# Patient Record
Sex: Female | Born: 1995 | Race: Black or African American | Hispanic: No | Marital: Single | Smoking: Current every day smoker
Health system: Southern US, Community
[De-identification: ages and names within clinical notes are randomized; demographics above are authoritative.]

---
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
## 2018-03-20 ENCOUNTER — Emergency Department (HOSPITAL_COMMUNITY)
Admission: EM | Admit: 2018-03-20 | Discharge: 2018-03-20 | Disposition: A | Discharge status: Home / Self Care | Payer: Medicaid Other | Attending: Emergency Medicine | Admitting: Emergency Medicine

## 2018-03-20 ENCOUNTER — Encounter (HOSPITAL_COMMUNITY): Payer: Self-pay

## 2018-03-20 ENCOUNTER — Emergency Department (HOSPITAL_COMMUNITY): Payer: Medicaid Other

## 2018-03-20 ENCOUNTER — Other Ambulatory Visit: Payer: Self-pay

## 2018-03-20 DIAGNOSIS — F17210 Nicotine dependence, cigarettes, uncomplicated: Secondary | ICD-10-CM | POA: Insufficient documentation

## 2018-03-20 DIAGNOSIS — R1032 Left lower quadrant pain: Secondary | ICD-10-CM | POA: Insufficient documentation

## 2018-03-20 LAB — COMPREHENSIVE METABOLIC PANEL WITH GFR
ALT: 13 U/L (ref 0–44)
AST: 22 U/L (ref 15–41)
Albumin: 5 g/dL (ref 3.5–5.0)
Alkaline Phosphatase: 68 U/L (ref 38–126)
Anion gap: 8 (ref 5–15)
BUN: 9 mg/dL (ref 6–20)
CO2: 23 mmol/L (ref 22–32)
Calcium: 9.3 mg/dL (ref 8.9–10.3)
Chloride: 106 mmol/L (ref 98–111)
Creatinine, Ser: 0.71 mg/dL (ref 0.44–1.00)
GFR calc Af Amer: >60 mL/min
GFR calc non Af Amer: >60 mL/min
Glucose, Bld: 85 mg/dL (ref 70–99)
Potassium: 3.9 mmol/L (ref 3.5–5.1)
Sodium: 137 mmol/L (ref 135–145)
Total Bilirubin: 0.1 mg/dL — ABNORMAL LOW (ref 0.3–1.2)
Total Protein: 8.8 g/dL — ABNORMAL HIGH (ref 6.5–8.1)

## 2018-03-20 LAB — CBC WITH DIFFERENTIAL/PLATELET
ABS IMMATURE GRANULOCYTES: 0.01 10*3/uL (ref 0.00–0.07)
Basophils Absolute: 0.1 10*3/uL (ref 0.0–0.1)
Basophils Relative: 1 %
Eosinophils Absolute: 0.1 10*3/uL (ref 0.0–0.5)
Eosinophils Relative: 2 %
HEMATOCRIT: 43.7 % (ref 36.0–46.0)
Hemoglobin: 13.7 g/dL (ref 12.0–15.0)
IMMATURE GRANULOCYTES: 0 %
LYMPHS ABS: 2.7 10*3/uL (ref 0.7–4.0)
LYMPHS PCT: 39 %
MCH: 29 pg (ref 26.0–34.0)
MCHC: 31.4 g/dL (ref 30.0–36.0)
MCV: 92.4 fL (ref 80.0–100.0)
Monocytes Absolute: 0.7 10*3/uL (ref 0.1–1.0)
Monocytes Relative: 9 %
NEUTROS ABS: 3.5 10*3/uL (ref 1.7–7.7)
NEUTROS PCT: 49 %
PLATELETS: 290 10*3/uL (ref 150–400)
RBC: 4.73 MIL/uL (ref 3.87–5.11)
RDW: 12.5 % (ref 11.5–15.5)
WBC: 7 10*3/uL (ref 4.0–10.5)
nRBC: 0 % (ref 0.0–0.2)

## 2018-03-20 LAB — URINALYSIS, ROUTINE W REFLEX MICROSCOPIC
Bilirubin Urine: NEGATIVE
GLUCOSE, UA: NEGATIVE mg/dL
HGB URINE DIPSTICK: NEGATIVE
KETONES UR: NEGATIVE mg/dL
LEUKOCYTE UA: NEGATIVE
Nitrite: NEGATIVE
PROTEIN: NEGATIVE mg/dL
Specific Gravity, Urine: 1.025 (ref 1.005–1.030)
pH: 5 (ref 5.0–8.0)

## 2018-03-20 LAB — LIPASE, BLOOD: Lipase: 34 U/L (ref 11–51)

## 2018-03-20 LAB — PREGNANCY, URINE: Preg Test, Ur: NEGATIVE

## 2018-03-20 MED ORDER — IOHEXOL 300 MG/ML  SOLN
100.0000 mL | Freq: Once | INTRAMUSCULAR | Status: AC | PRN
  Administered 2018-03-20: 100 mL via INTRAVENOUS

## 2018-03-20 MED ORDER — FENTANYL CITRATE (PF) 100 MCG/2ML IJ SOLN
50.0000 ug | Freq: Once | INTRAMUSCULAR | Status: AC
  Administered 2018-03-20: 50 ug via INTRAVENOUS
  Filled 2018-03-20: qty 2

## 2018-03-20 MED ORDER — DICYCLOMINE HCL 20 MG PO TABS: 20.0000 mg | ORAL_TABLET | Freq: Three times a day (TID) | ORAL | 0 refills | Status: DC | PRN

## 2018-03-20 MED ORDER — IBUPROFEN 800 MG PO TABS: 800.0000 mg | ORAL_TABLET | Freq: Three times a day (TID) | ORAL | 0 refills | Status: DC | PRN

## 2018-03-20 MED ORDER — SODIUM CHLORIDE 0.9 % IV BOLUS
500.0000 mL | Freq: Once | INTRAVENOUS | Status: AC
  Administered 2018-03-20: 500 mL via INTRAVENOUS

## 2018-03-20 NOTE — ED Triage Notes (Signed)
Pt reports severe lower left abdominal pain that radiates to pelvic bone. Pt reports pain is intermittent at this time. Denies vaginal discharge. Denies urinary symptoms

## 2018-03-20 NOTE — ED Provider Notes (Signed)
Emergency Department Provider Note   I have reviewed the triage vital signs and the nursing notes.   HISTORY  Chief Complaint Abdominal Pain   HPI Kayla Day is a 23 y.o. female with no significant PMH presents to the emergency department for evaluation of acute onset left lower quadrant abdominal pain radiating to the flank.  Patient describes intermittent, severe pain.  She is not experiencing any vaginal bleeding or discharge.  No UTI symptoms.  No fevers or chills.  No gross hematuria.  No pain similar to this in the past.  She denies any diarrhea or constipation symptoms.  No blood in the bowel movements.  No history of ovarian cysts.  She noticed pain while driving today states that it is been very regular but intermittent.  No clear exacerbating or modifying factors.    History reviewed. No pertinent past medical history.  There are no active problems to display for this patient.   History reviewed. No pertinent surgical history.   Allergies Bee venom; Hydrocodone; and Other  No family history on file.  Social History Social History   Tobacco Use  . Smoking status: Current Every Day Smoker    Packs/day: 1.00    Types: Cigarettes  Substance Use Topics  . Alcohol use: Yes    Comment: occ  . Drug use: Never    Review of Systems  Constitutional: No fever/chills Eyes: No visual changes. ENT: No sore throat. Cardiovascular: Denies chest pain. Respiratory: Denies shortness of breath. Gastrointestinal: Positive lower abdominal pain.  No nausea, no vomiting.  No diarrhea.  No constipation. Genitourinary: Negative for dysuria. Musculoskeletal: Negative for back pain. Skin: Negative for rash. Neurological: Negative for headaches, focal weakness or numbness.  10-point ROS otherwise negative.  ____________________________________________   PHYSICAL EXAM:  VITAL SIGNS: ED Triage Vitals  Enc Vitals Group     BP 03/20/18 1609 130/81     Pulse Rate  03/20/18 1609 91     Resp 03/20/18 1609 18     Temp 03/20/18 1609 98.2 F (36.8 C)     Temp Source 03/20/18 1609 Oral     SpO2 03/20/18 1609 98 %     Weight 03/20/18 1610 136 lb (61.7 kg)     Height 03/20/18 1610 5\' 8"  (1.727 m)     Pain Score 03/20/18 1610 9   Constitutional: Alert and oriented. Well appearing and in no acute distress. Eyes: Conjunctivae are normal.  Head: Atraumatic. Nose: No congestion/rhinnorhea. Mouth/Throat: Mucous membranes are moist.  Neck: No stridor.   Cardiovascular: Normal rate, regular rhythm. Good peripheral circulation. Grossly normal heart sounds.   Respiratory: Normal respiratory effort.  No retractions. Lungs CTAB. Gastrointestinal: Soft with mild diffuse tenderness to focal tenderness in the LLQ. No rebound or guarding. No CVA tenderness to percussion. No distention.  Musculoskeletal: No lower extremity tenderness nor edema. No gross deformities of extremities. Neurologic:  Normal speech and language. No gross focal neurologic deficits are appreciated.  Skin:  Skin is warm, dry and intact. No rash noted.  ____________________________________________   LABS (all labs ordered are listed, but only abnormal results are displayed)  Labs Reviewed  COMPREHENSIVE METABOLIC PANEL - Abnormal; Notable for the following components:      Result Value   Total Protein 8.8 (*)    Total Bilirubin 0.1 (*)    All other components within normal limits  URINALYSIS, ROUTINE W REFLEX MICROSCOPIC  PREGNANCY, URINE  LIPASE, BLOOD  CBC WITH DIFFERENTIAL/PLATELET   ____________________________________________  RADIOLOGY  US Transvaginal Non-ob  Result Date: 03/20/2018 CLINICAL DATA:  LEFT lower quadrant pain. EXAM: TRANSABDOMINAL AND TRANSVAGINAL ULTRASOUND OF PELVIS DOPPLER ULTRASOUND OF OVARIES TECHNIQUE: Both transabdominal and transvaginal ultrasound examinations of the pelvis were performed. Transabdominal technique was performed for global imaging of the  pelvis including uterus, ovaries, adnexal regions, and pelvic cul-de-sac. It was necessary to proceed with endovaginal exam following the transabdominal exam to visualize the endometrium and adnexa. Color and duplex Doppler ultrasound was utilized to evaluate blood flow to the ovaries. COMPARISON:  None. FINDINGS: Uterus Measurements: 6.9 x 3.1 x 4.3 cm = volume: 49 mL. No fibroids or other mass visualized. Endometrium Thickness: 6 mm.  No focal abnormality visualized. Right ovary Measurements: 4 x 1.4 x 3.5 cm = volume: 10.2 mL. Normal appearance/no adnexal mass. Left ovary Measurements: 3.6 x 1.2 x 1.9 cm = volume: 4.3 mL. Normal appearance/no adnexal mass. Pulsed Doppler evaluation of both ovaries demonstrates normal low-resistance arterial and venous waveforms. Other findings Small volume free fluid in the pelvis within physiologic range. IMPRESSION: Normal pelvic ultrasound. Electronically Signed   By: Awilda Metro M.D.   On: 03/20/2018 21:43   US Pelvis Complete  Result Date: 03/20/2018 CLINICAL DATA:  LEFT lower quadrant pain. EXAM: TRANSABDOMINAL AND TRANSVAGINAL ULTRASOUND OF PELVIS DOPPLER ULTRASOUND OF OVARIES TECHNIQUE: Both transabdominal and transvaginal ultrasound examinations of the pelvis were performed. Transabdominal technique was performed for global imaging of the pelvis including uterus, ovaries, adnexal regions, and pelvic cul-de-sac. It was necessary to proceed with endovaginal exam following the transabdominal exam to visualize the endometrium and adnexa. Color and duplex Doppler ultrasound was utilized to evaluate blood flow to the ovaries. COMPARISON:  None. FINDINGS: Uterus Measurements: 6.9 x 3.1 x 4.3 cm = volume: 49 mL. No fibroids or other mass visualized. Endometrium Thickness: 6 mm.  No focal abnormality visualized. Right ovary Measurements: 4 x 1.4 x 3.5 cm = volume: 10.2 mL. Normal appearance/no adnexal mass. Left ovary Measurements: 3.6 x 1.2 x 1.9 cm = volume: 4.3 mL.  Normal appearance/no adnexal mass. Pulsed Doppler evaluation of both ovaries demonstrates normal low-resistance arterial and venous waveforms. Other findings Small volume free fluid in the pelvis within physiologic range. IMPRESSION: Normal pelvic ultrasound. Electronically Signed   By: Awilda Metro M.D.   On: 03/20/2018 21:43   Ct Abdomen Pelvis W Contrast  Result Date: 03/20/2018 CLINICAL DATA:  Left-sided abdominal pain EXAM: CT ABDOMEN AND PELVIS WITH CONTRAST TECHNIQUE: Multidetector CT imaging of the abdomen and pelvis was performed using the standard protocol following bolus administration of intravenous contrast. CONTRAST:  OMNIPAQUE IOHEXOL 300 MG/ML  SOLN COMPARISON:  CT 03/01/2015 FINDINGS: Lower chest: No acute abnormality. Hepatobiliary: No focal liver abnormality is seen. No gallstones, gallbladder wall thickening, or biliary dilatation. Pancreas: Unremarkable. No pancreatic ductal dilatation or surrounding inflammatory changes. Spleen: Normal in size without focal abnormality. Adrenals/Urinary Tract: Adrenal glands are unremarkable. Kidneys are normal, without renal calculi, focal lesion, or hydronephrosis. Bladder is unremarkable. Stomach/Bowel: Stomach is within normal limits. Appendix appears normal. No evidence of bowel wall thickening, distention, or inflammatory changes. Vascular/Lymphatic: No significant vascular findings are present. No enlarged abdominal or pelvic lymph nodes. Reproductive: Uterus and bilateral adnexa are unremarkable. Other: No free air.  Small amount of free fluid in the pelvis Musculoskeletal: No acute or significant osseous findings. IMPRESSION: 1. No CT evidence for acute intra-abdominal or pelvic abnormality. 2. Small amount of free fluid in the pelvis Electronically Signed   By: Adrian Prows.D.  On: 03/20/2018 19:43   [place] Art/ven Flow Abd Pelv Doppler  Result Date: 03/20/2018 CLINICAL DATA:  LEFT lower quadrant pain. EXAM: TRANSABDOMINAL AND  TRANSVAGINAL ULTRASOUND OF PELVIS DOPPLER ULTRASOUND OF OVARIES TECHNIQUE: Both transabdominal and transvaginal ultrasound examinations of the pelvis were performed. Transabdominal technique was performed for global imaging of the pelvis including uterus, ovaries, adnexal regions, and pelvic cul-de-sac. It was necessary to proceed with endovaginal exam following the transabdominal exam to visualize the endometrium and adnexa. Color and duplex Doppler ultrasound was utilized to evaluate blood flow to the ovaries. COMPARISON:  None. FINDINGS: Uterus Measurements: 6.9 x 3.1 x 4.3 cm = volume: 49 mL. No fibroids or other mass visualized. Endometrium Thickness: 6 mm.  No focal abnormality visualized. Right ovary Measurements: 4 x 1.4 x 3.5 cm = volume: 10.2 mL. Normal appearance/no adnexal mass. Left ovary Measurements: 3.6 x 1.2 x 1.9 cm = volume: 4.3 mL. Normal appearance/no adnexal mass. Pulsed Doppler evaluation of both ovaries demonstrates normal low-resistance arterial and venous waveforms. Other findings Small volume free fluid in the pelvis within physiologic range. IMPRESSION: Normal pelvic ultrasound. Electronically Signed   By: Awilda Metro M.D.   On: 03/20/2018 21:43    ____________________________________________   PROCEDURES  Procedure(s) performed:   Procedures  None  ____________________________________________   INITIAL IMPRESSION / ASSESSMENT AND PLAN / ED COURSE  Pertinent labs & imaging results that were available during my care of the patient were reviewed by me and considered in my medical decision making (see chart for details).  Patient describes intermittent left lower quadrant abdominal pain radiating to the flank.  Differential at this time is broad and includes GI, ureteral stone, torsion.  Lower suspicion for torsion given radiation of symptoms and tenderness on abdominal exam.  Plan to begin with CT abdomen pelvis along with IV fluids, pain medication, labs, and  reassess.  Patient CT scan with no acute findings.  Pain is improved somewhat.  Labs are largely unremarkable.  Given the severe, lower abdominal symptoms and character of her pain I discussed with the patient obtaining a transvaginal ultrasound to rule out ovarian torsion.  Discussed that I would have to call in a radiology technician to perform the scan. Patient agreeable with plan.   08:40 PM I was called back to the room with patient becoming very agitated and angry.  She felt like her nurse was rude to her.  She began cursing and shouting and security had to be called to de-escalate the situation.  The patient's mother was asked to wait in the waiting room.  I went back and discussed things with the patient.  She agrees to stay for the ultrasound but I made clear that she will not be allowed to curse at staff or else she would be asked to leave.  Patient verbalized understanding and is more calm on my reevaluation.   TVUS negative for torsion. Updated the patient who was calm and appreciative. Motrin Rx and Bentyl given and discussed with the patient along with follow up plan. Apparently, patient and family yelling at nursing on the way out of the ED.  ____________________________________________  FINAL CLINICAL IMPRESSION(S) / ED DIAGNOSES  Final diagnoses:  Left lower quadrant abdominal pain     MEDICATIONS GIVEN DURING THIS VISIT:  Medications  sodium chloride 0.9 % bolus 500 mL (0 mLs Intravenous Stopped 03/20/18 2015)  fentaNYL (SUBLIMAZE) injection 50 mcg (50 mcg Intravenous Given 03/20/18 1834)  iohexol (OMNIPAQUE) 300 MG/ML solution 100 mL (100 mLs Intravenous  Contrast Given 03/20/18 1924)     NEW OUTPATIENT MEDICATIONS STARTED DURING THIS VISIT:  Discharge Medication List as of 03/20/2018  9:52 PM    START taking these medications   Details  dicyclomine (BENTYL) 20 MG tablet Take 1 tablet (20 mg total) by mouth 3 (three) times daily as needed for spasms (abdominal cramping  pain)., Starting Wed 03/20/2018, Print    ibuprofen (ADVIL,MOTRIN) 800 MG tablet Take 1 tablet (800 mg total) by mouth every 8 (eight) hours as needed for moderate pain., Starting Wed 03/20/2018, Print        Note:  This document was prepared using Dragon voice recognition software and may include unintentional dictation errors.  Alona Bene, MD Emergency Medicine    Long, Arlyss Repress, MD 03/21/18 [phone]

## 2018-03-20 NOTE — ED Notes (Signed)
This RN walked into pt's room after hearing the pt yelling at the [place], [place] who was d/cing her from the nurse's station. Pt was wanting to know "who the head nurse was who talked to her mama?" This RN went to room and told the pt that the head nurse Olegario Messier was in an emergency and was unable to come speak to her. Pt said "well why can't my mom or my friend come back here?" I stated that the mom was unable to since she was yelling, cussing and getting in staff member's faces. Pt began yelling louder stating "well that other fucking nurse should not had talked to me that way and she wouldn't not have been cussing. My friend didn't do anything back here and she had to bring me stuff that I needed." This RN just walked away while Dois Davenport, Charity fundraiser finished d/cing her.

## 2018-03-20 NOTE — ED Notes (Signed)
This nurse tried deescalating the situation by speaking with the mother of the patient. Mother of pt was in the hallway screaming and cursing at this nurse. I tried calming the mother by asking her to let me speak and ask questions about the situation. Mother continued to scream and curse at staff and myself. Security notified.

## 2018-03-20 NOTE — ED Notes (Signed)
Pt upset due to wait time for ultrasound. I asked pt if she was going to stay or go. Pt got upset and started cursing at me and door was closed I told charge nurse I was done taking care of pt due to cursing at me. Pt's mom came back and started cursing me at nursing station because she heard me tell charge nurse that I was done taking care of her. Security was called.

## 2018-03-20 NOTE — Discharge Instructions (Signed)

## 2020-06-13 IMAGING — US TRANSVAGINAL ULTRASOUND OF PELVIS
1 series · 13 of 25 positions shown · non-contrast
Comparison: None.

CLINICAL DATA: LEFT lower quadrant pain.

EXAM:
TRANSABDOMINAL AND TRANSVAGINAL ULTRASOUND OF PELVIS
DOPPLER ULTRASOUND OF OVARIES
TECHNIQUE: Both transabdominal and transvaginal ultrasound examinations of the
pelvis were performed. Transabdominal technique was performed for
global imaging of the pelvis including uterus, ovaries, adnexal
regions, and pelvic cul-de-sac.
It was necessary to proceed with endovaginal exam following the
transabdominal exam to visualize the endometrium and adnexa. Color
and duplex Doppler ultrasound was utilized to evaluate blood flow to
the ovaries.

[Series 1: transvaginal ultrasound of pelvis · 0.20mm/px · 13 of 117 slices shown]
[im 1/117]
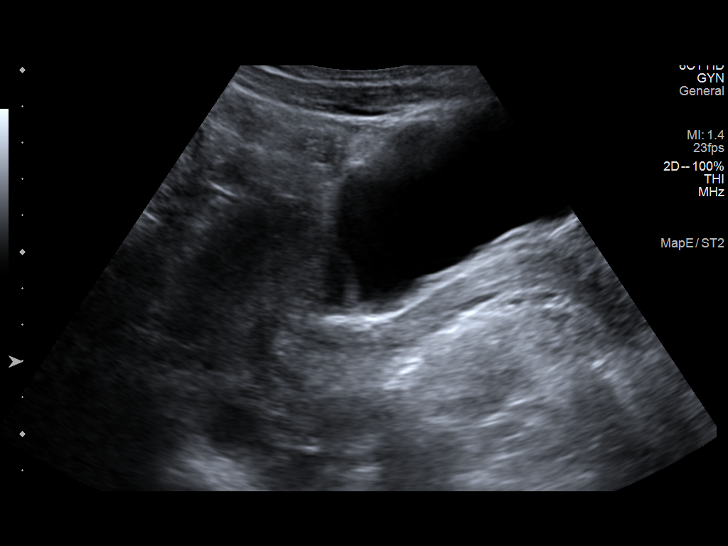
[im 10/117]
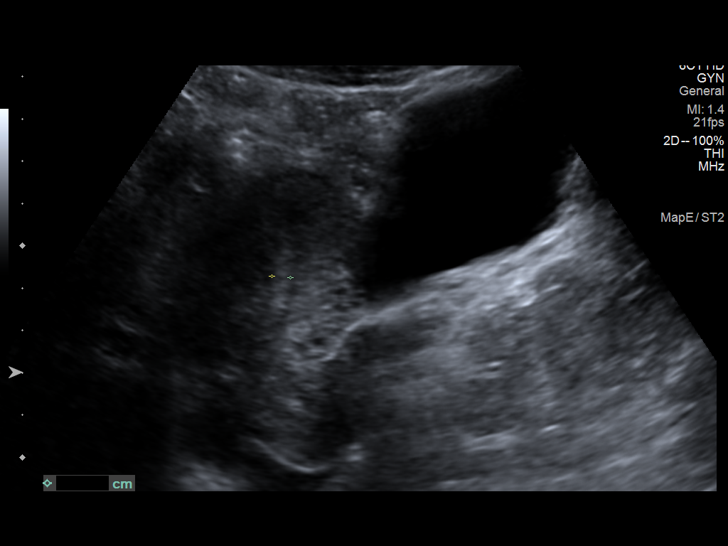
[im 20/117]
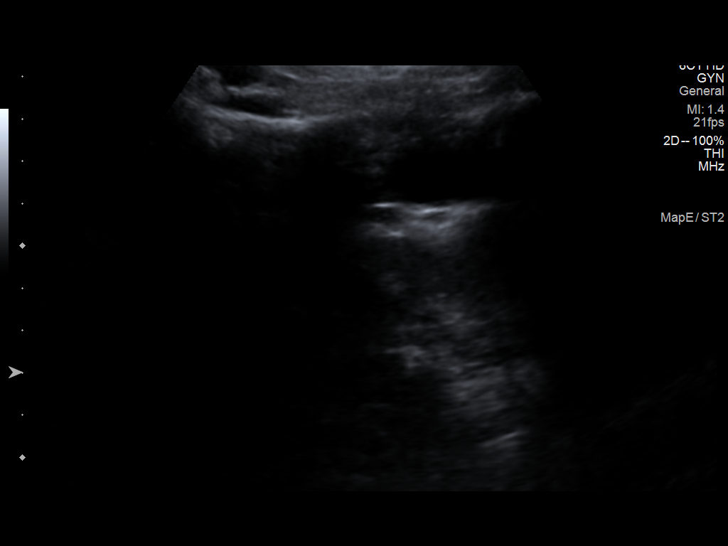
[im 30/117]
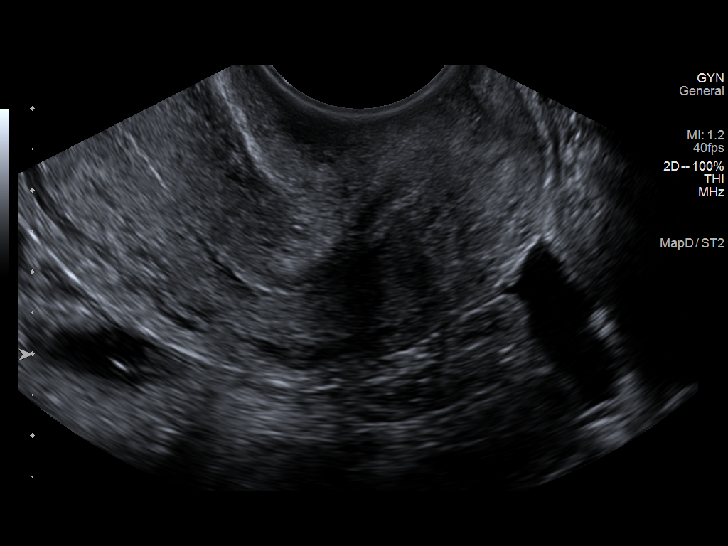
[im 39/117]
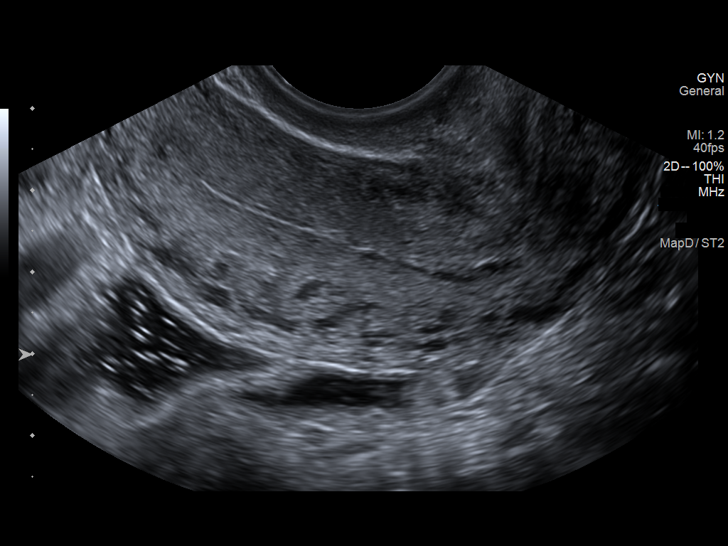
[im 49/117]
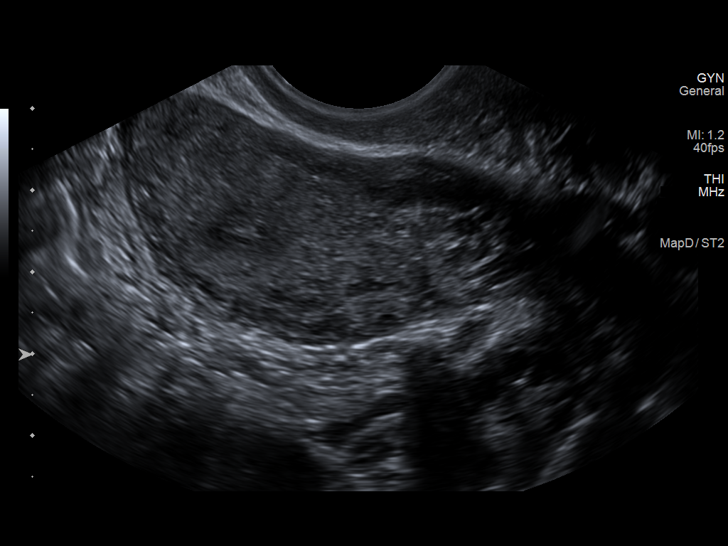
[im 59/117]
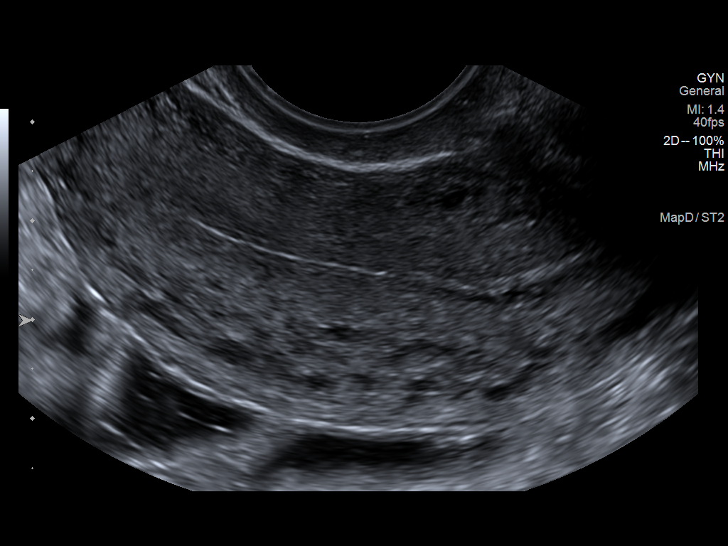
[im 68/117]
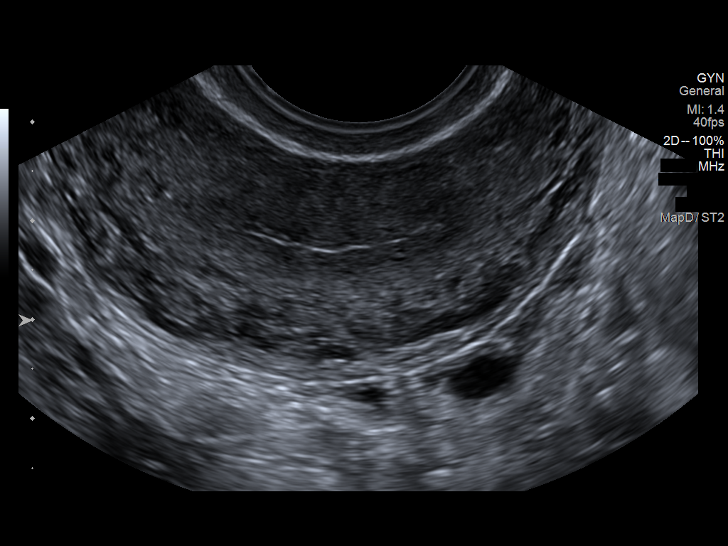
[im 78/117]
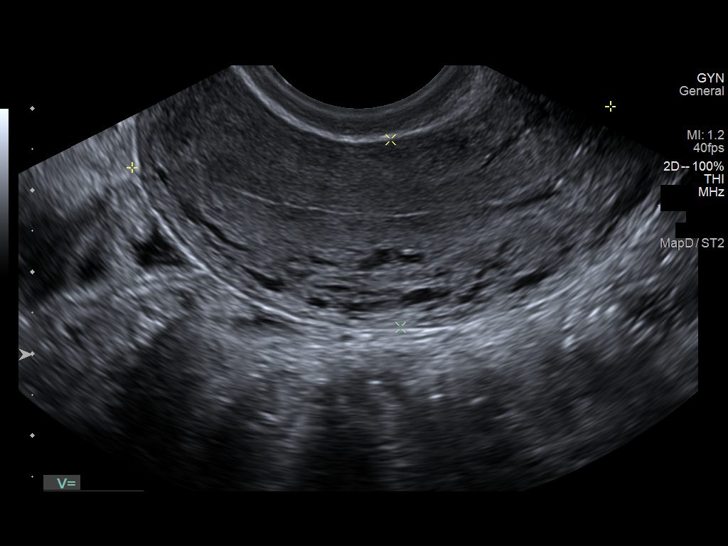
[im 88/117]
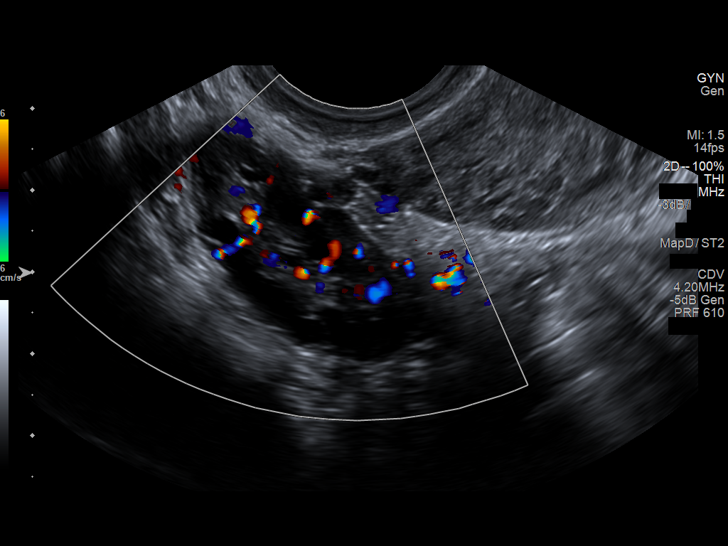
[im 97/117]
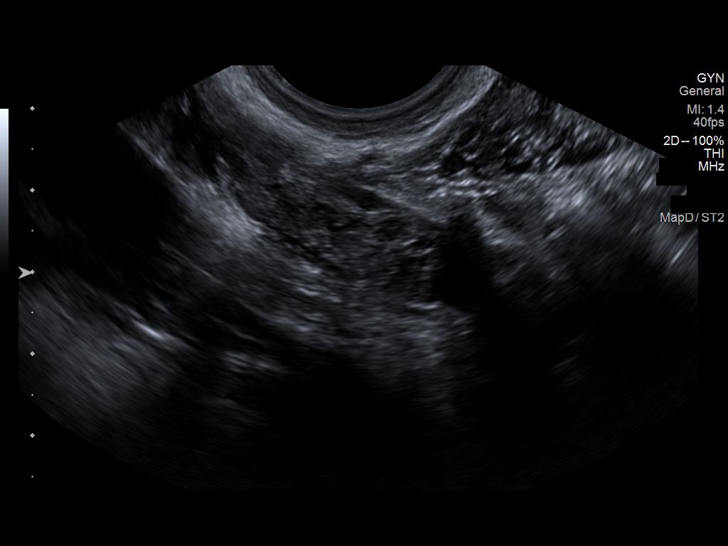
[im 107/117]
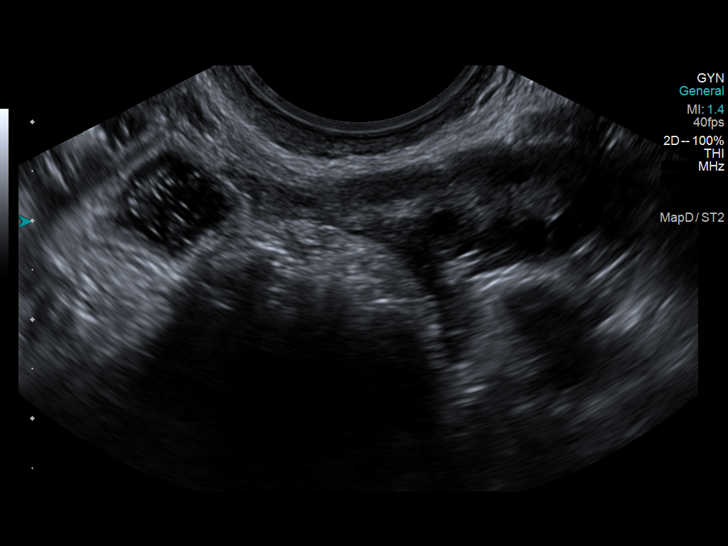
[im 117/117]
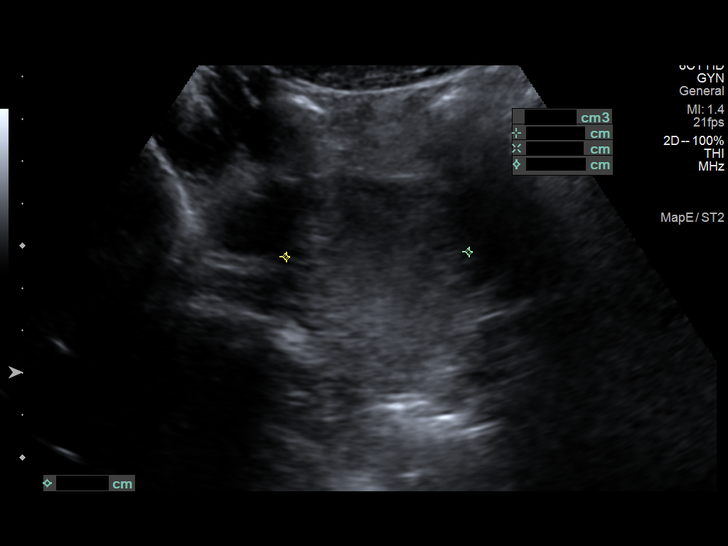

[13 of 25 positions shown; findings below may reference images not displayed]

FINDINGS: Uterus

Measurements: 6.9 x 3.1 x 4.3 cm = volume: 49 mL. No fibroids or
other mass visualized.

Endometrium

Thickness: 6 mm.  No focal abnormality visualized.

Right ovary

Measurements: 4 x 1.4 x 3.5 cm = volume: 10.2 mL. Normal
appearance/no adnexal mass.

Left ovary

Measurements: 3.6 x 1.2 x 1.9 cm = volume: 4.3 mL. Normal
appearance/no adnexal mass.

Pulsed Doppler evaluation of both ovaries demonstrates normal
low-resistance arterial and venous waveforms.

Other findings

Small volume free fluid in the pelvis within physiologic range.
IMPRESSION: Normal pelvic ultrasound.

## 2020-06-13 IMAGING — CT CT ABD-PELV W/ CM
2 of 4 series · 15 of 46 positions shown, 17 images · IV contrast (Isovue)
Comparison: CT 03/01/2015

CLINICAL DATA: Left-sided abdominal pain

EXAM:
CT ABDOMEN AND PELVIS WITH CONTRAST
TECHNIQUE: Multidetector CT imaging of the abdomen and pelvis was performed
using the standard protocol following bolus administration of
intravenous contrast.
CONTRAST:  100mL OMNIPAQUE IOHEXOL 300 MG/ML  SOLN

[Series 2: axial st · axial · 0.58mm/px · z∈[-457,-67]mm · 12 of 86 slices shown, 14 images]
[im 4/86  soft-tissue]
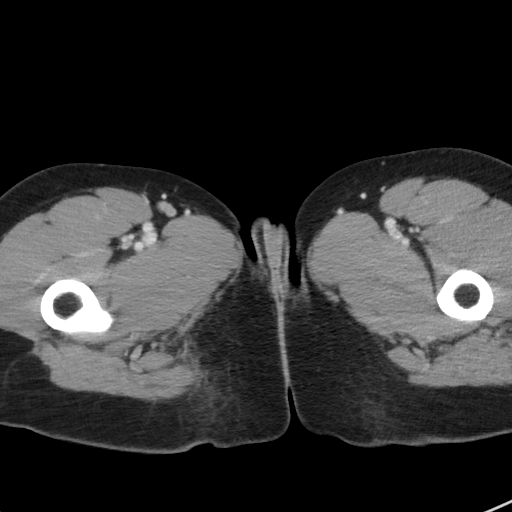
[im 4/86  bone]
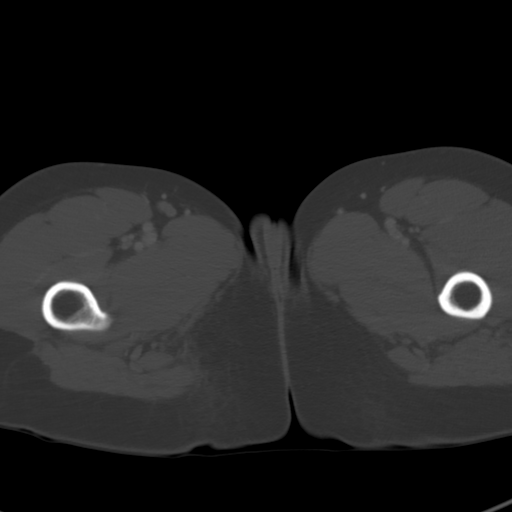
[im 11/86  soft-tissue]
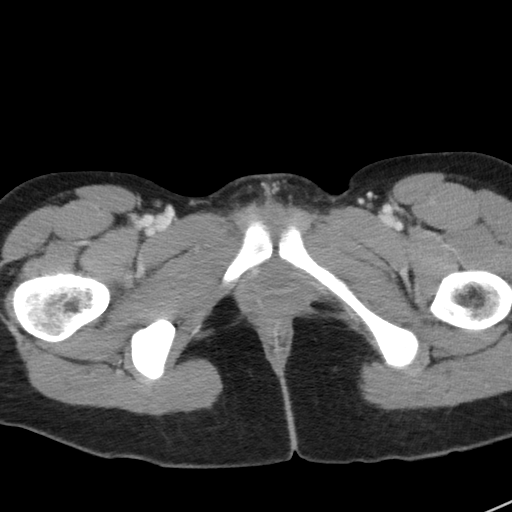
[im 18/86  soft-tissue]
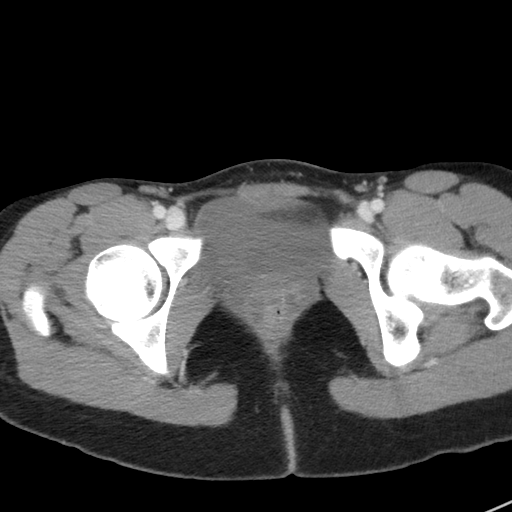
[im 25/86  soft-tissue]
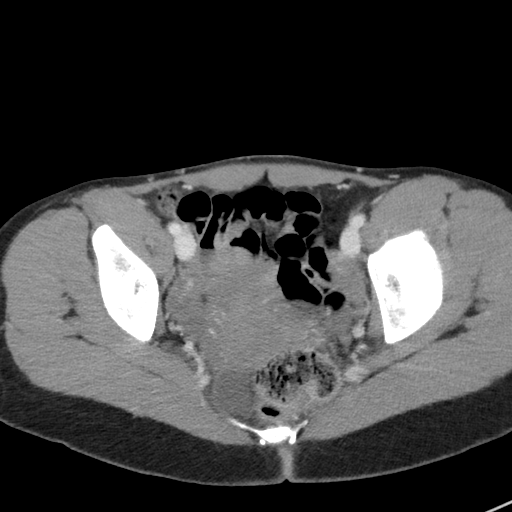
[im 32/86  soft-tissue]
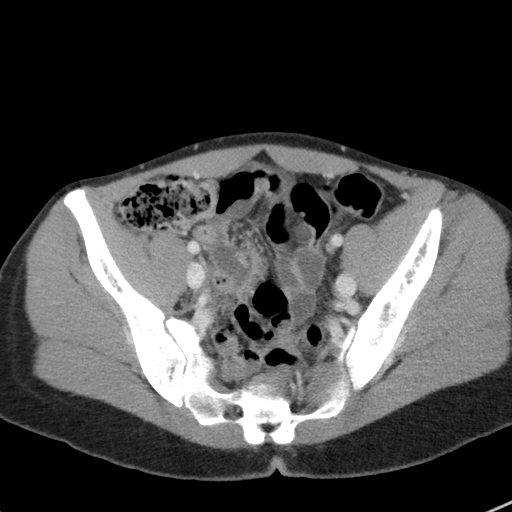
[im 39/86  soft-tissue]
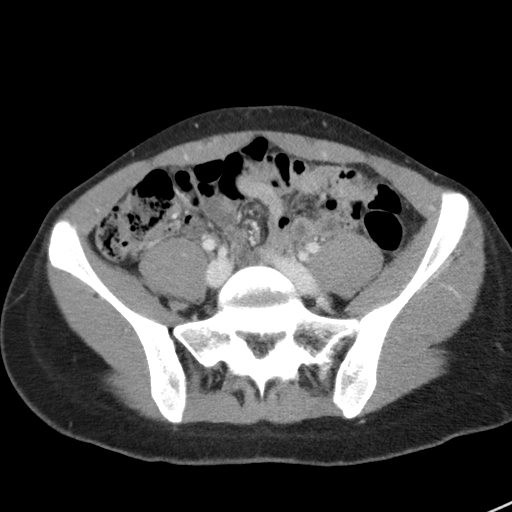
[im 47/86  soft-tissue]
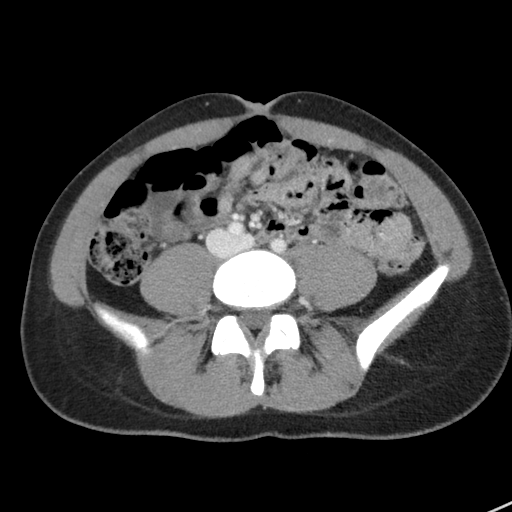
[im 54/86  soft-tissue]
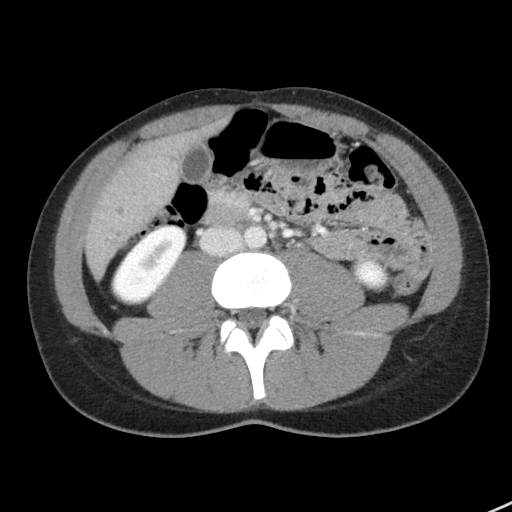
[im 61/86  soft-tissue]
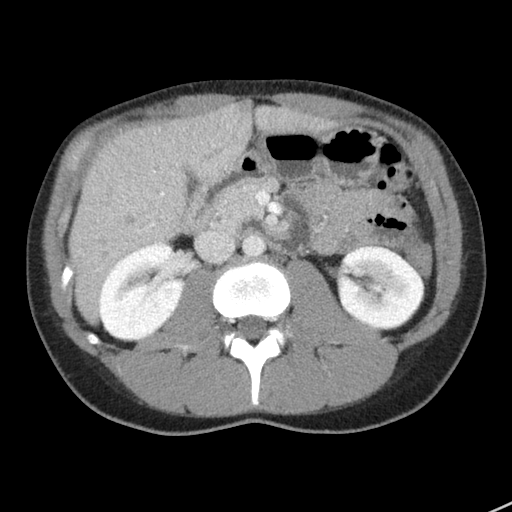
[im 61/86  bone]
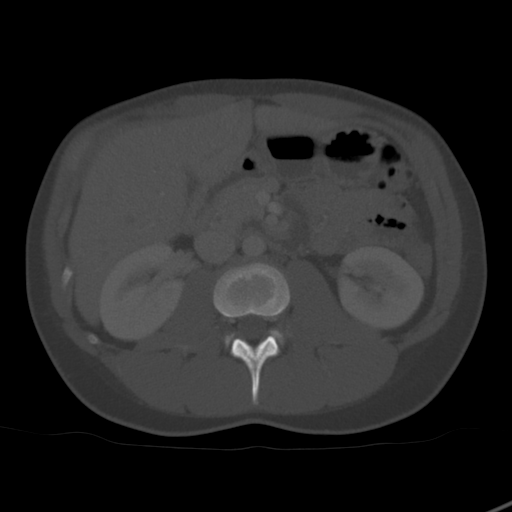
[im 68/86  soft-tissue]
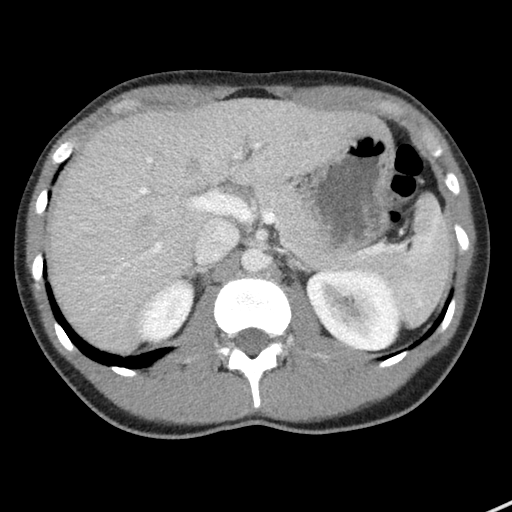
[im 75/86  soft-tissue]
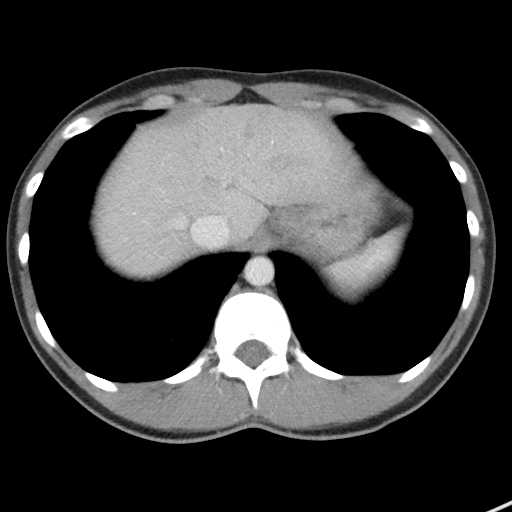
[im 82/86  soft-tissue]
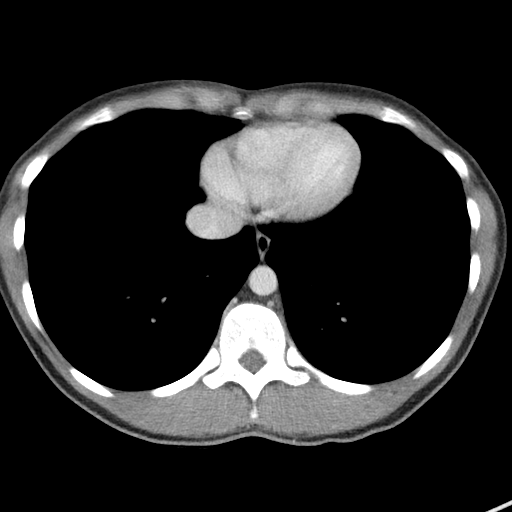

[Series 5: coronal st · coronal · 0.55mm/px · 3 of 69 slices shown]
[im 23/69  soft-tissue]
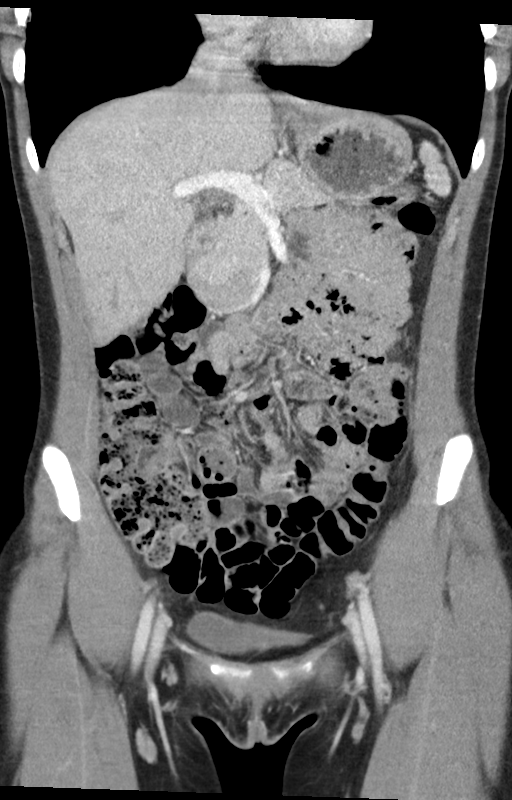
[im 31/69  soft-tissue]
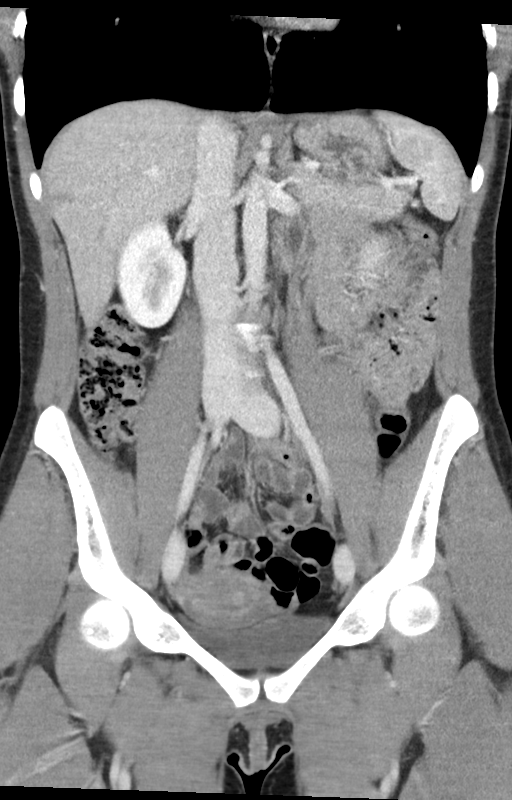
[im 38/69  soft-tissue]
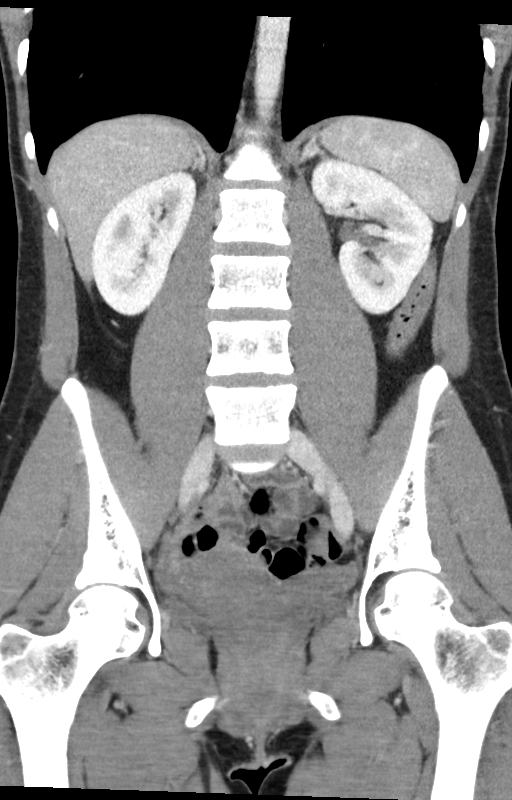

[15 of 46 positions shown; findings below may reference images not displayed]

FINDINGS: Lower chest: No acute abnormality.

Hepatobiliary: No focal liver abnormality is seen. No gallstones,
gallbladder wall thickening, or biliary dilatation.

Pancreas: Unremarkable. No pancreatic ductal dilatation or
surrounding inflammatory changes.

Spleen: Normal in size without focal abnormality.

Adrenals/Urinary Tract: Adrenal glands are unremarkable. Kidneys are
normal, without renal calculi, focal lesion, or hydronephrosis.
Bladder is unremarkable.

Stomach/Bowel: Stomach is within normal limits. Appendix appears
normal. No evidence of bowel wall thickening, distention, or
inflammatory changes.

Vascular/Lymphatic: No significant vascular findings are present. No
enlarged abdominal or pelvic lymph nodes.

Reproductive: Uterus and bilateral adnexa are unremarkable.

Other: No free air.  Small amount of free fluid in the pelvis

Musculoskeletal: No acute or significant osseous findings.
IMPRESSION: 1. No CT evidence for acute intra-abdominal or pelvic abnormality.
2. Small amount of free fluid in the pelvis

## 2020-08-12 ENCOUNTER — Ambulatory Visit (INDEPENDENT_AMBULATORY_CARE_PROVIDER_SITE_OTHER): Payer: Medicaid Other | Admitting: Orthopaedic Surgery

## 2020-08-12 ENCOUNTER — Ambulatory Visit (INDEPENDENT_AMBULATORY_CARE_PROVIDER_SITE_OTHER): Payer: Self-pay

## 2020-08-12 ENCOUNTER — Other Ambulatory Visit: Payer: Self-pay

## 2020-08-12 VITALS — Ht 68.0 in | Wt 146.0 lb

## 2020-08-12 DIAGNOSIS — M67432 Ganglion, left wrist: Secondary | ICD-10-CM

## 2020-08-12 DIAGNOSIS — M25532 Pain in left wrist: Secondary | ICD-10-CM

## 2020-08-12 MED ORDER — BUPIVACAINE HCL 0.25 % IJ SOLN
1.0000 mL | INTRAMUSCULAR | Status: AC | PRN
  Administered 2020-08-12: 1 mL

## 2020-08-12 NOTE — Progress Notes (Signed)
   Office Visit Note   Patient: Kayla Day           Date of Birth: Apr 01, 1995           MRN: [id number] Visit Date: 08/12/2020              Requested by: [facility] [address] [place],  [place] [address] PCP: [facility]   Assessment & Plan: Visit Diagnoses:  1. Pain in left wrist   2. Ganglion cyst of dorsum of left wrist     Plan: Aspiration left wrist obtained with typical ganglion fluid.  Follow-Up Instructions: Return if symptoms worsen or fail to improve.   Orders:  Orders Placed This Encounter  Procedures   Hand/UE Inj   XR Wrist 2 Views Left   No orders of the defined types were placed in this encounter.     Procedures: Hand/UE Inj: L wrist for dorsal carpal ganglion on 08/12/2020 2:31 PM Medications: 1 mL bupivacaine 0.25 % Aspirate: 2 mL clear  We discussed 50% recurrence with aspiration.  She can return if she has ongoing problems we discussed 90% resolution with excision of the ganglion.  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  She can get a wrist splint that she can use intermittently which we discussed.  Follow-up as needed.   Clinical Data: No additional findings.   Subjective: Chief Complaint  Patient presents with   Left Hand - Pain    HPI  Review of Systems   Objective: Vital Signs: Ht 5\' 8"  (1.727 m)   Wt 146 lb (66.2 kg)   BMI 22.20 kg/m   Physical Exam  Ortho Exam  Specialty Comments:  No specialty comments available.  Imaging: XR Wrist 2 Views Left  Result Date: 08/12/2020 2 view x-rays left wrist obtained and reviewed.  This shows normal bony anatomy.  Normal soft tissue. Impression: Normal left wrist radiographs.    PMFS History: Patient Active Problem List   Diagnosis Date Noted   Ganglion cyst of dorsum of left wrist 08/12/2020    No past medical history on file.  No family history on file.  No past surgical history on file. Social History    Occupational History   Not on file  Tobacco Use   Smoking status: Every Day    Packs/day: 1.00    Types: Cigarettes   Smokeless tobacco: Not on file  Substance and Sexual Activity   Alcohol use: Yes    Comment: occ   Drug use: Never   Sexual activity: Not on file

## 2020-11-12 DIAGNOSIS — K029 Dental caries, unspecified: Secondary | ICD-10-CM | POA: Diagnosis not present

## 2020-11-29 DIAGNOSIS — F17210 Nicotine dependence, cigarettes, uncomplicated: Secondary | ICD-10-CM | POA: Diagnosis not present

## 2020-11-29 DIAGNOSIS — Z309 Encounter for contraceptive management, unspecified: Secondary | ICD-10-CM | POA: Diagnosis not present

## 2020-12-13 DIAGNOSIS — J209 Acute bronchitis, unspecified: Secondary | ICD-10-CM | POA: Diagnosis not present

## 2020-12-13 DIAGNOSIS — R059 Cough, unspecified: Secondary | ICD-10-CM | POA: Diagnosis not present

## 2020-12-13 DIAGNOSIS — Z87891 Personal history of nicotine dependence: Secondary | ICD-10-CM | POA: Diagnosis not present

## 2020-12-13 DIAGNOSIS — Z20822 Contact with and (suspected) exposure to covid-19: Secondary | ICD-10-CM | POA: Diagnosis not present

## 2021-02-01 ENCOUNTER — Other Ambulatory Visit (HOSPITAL_COMMUNITY): Payer: Self-pay

## 2021-02-01 DIAGNOSIS — Z6822 Body mass index (BMI) 22.0-22.9, adult: Secondary | ICD-10-CM | POA: Diagnosis not present

## 2021-02-01 DIAGNOSIS — F17210 Nicotine dependence, cigarettes, uncomplicated: Secondary | ICD-10-CM | POA: Diagnosis not present

## 2021-02-01 DIAGNOSIS — F419 Anxiety disorder, unspecified: Secondary | ICD-10-CM | POA: Diagnosis not present

## 2021-02-01 DIAGNOSIS — N926 Irregular menstruation, unspecified: Secondary | ICD-10-CM | POA: Diagnosis not present

## 2021-02-01 MED ORDER — ESCITALOPRAM OXALATE 10 MG PO TABS
ORAL_TABLET | ORAL | 0 refills | Status: DC
  Filled 2021-02-01: qty 30, 30d supply, fill #0

## 2021-02-01 MED ORDER — BUSPIRONE HCL 15 MG PO TABS
ORAL_TABLET | ORAL | 0 refills | Status: DC
  Filled 2021-02-01: qty 270, 90d supply, fill #0

## 2021-02-17 DIAGNOSIS — R059 Cough, unspecified: Secondary | ICD-10-CM | POA: Diagnosis not present

## 2021-02-17 DIAGNOSIS — Z20822 Contact with and (suspected) exposure to covid-19: Secondary | ICD-10-CM | POA: Diagnosis not present

## 2021-02-17 DIAGNOSIS — Z87891 Personal history of nicotine dependence: Secondary | ICD-10-CM | POA: Diagnosis not present

## 2021-02-17 DIAGNOSIS — B9789 Other viral agents as the cause of diseases classified elsewhere: Secondary | ICD-10-CM | POA: Diagnosis not present

## 2021-02-17 DIAGNOSIS — J069 Acute upper respiratory infection, unspecified: Secondary | ICD-10-CM | POA: Diagnosis not present

## 2021-02-21 ENCOUNTER — Other Ambulatory Visit (HOSPITAL_COMMUNITY): Payer: Self-pay

## 2021-02-21 DIAGNOSIS — Z309 Encounter for contraceptive management, unspecified: Secondary | ICD-10-CM | POA: Diagnosis not present

## 2021-02-21 MED ORDER — MEDROXYPROGESTERONE ACETATE 150 MG/ML IM SUSY
PREFILLED_SYRINGE | INTRAMUSCULAR | 0 refills | Status: AC
  Filled 2021-02-21: qty 1, 90d supply, fill #0

## 2021-03-19 DIAGNOSIS — R059 Cough, unspecified: Secondary | ICD-10-CM | POA: Diagnosis not present

## 2021-03-19 DIAGNOSIS — Z79899 Other long term (current) drug therapy: Secondary | ICD-10-CM | POA: Diagnosis not present

## 2021-03-19 DIAGNOSIS — R091 Pleurisy: Secondary | ICD-10-CM | POA: Diagnosis not present

## 2021-03-19 DIAGNOSIS — F419 Anxiety disorder, unspecified: Secondary | ICD-10-CM | POA: Diagnosis not present

## 2021-03-19 DIAGNOSIS — J40 Bronchitis, not specified as acute or chronic: Secondary | ICD-10-CM | POA: Diagnosis not present

## 2021-03-19 DIAGNOSIS — Z793 Long term (current) use of hormonal contraceptives: Secondary | ICD-10-CM | POA: Diagnosis not present

## 2021-03-19 DIAGNOSIS — R0600 Dyspnea, unspecified: Secondary | ICD-10-CM | POA: Diagnosis not present

## 2021-03-19 DIAGNOSIS — R791 Abnormal coagulation profile: Secondary | ICD-10-CM | POA: Diagnosis not present

## 2021-03-19 DIAGNOSIS — Z87891 Personal history of nicotine dependence: Secondary | ICD-10-CM | POA: Diagnosis not present

## 2021-03-19 DIAGNOSIS — R000 Tachycardia, unspecified: Secondary | ICD-10-CM | POA: Diagnosis not present

## 2021-03-19 DIAGNOSIS — Z20822 Contact with and (suspected) exposure to covid-19: Secondary | ICD-10-CM | POA: Diagnosis not present

## 2021-03-20 DIAGNOSIS — R791 Abnormal coagulation profile: Secondary | ICD-10-CM | POA: Diagnosis not present

## 2021-03-20 DIAGNOSIS — R000 Tachycardia, unspecified: Secondary | ICD-10-CM | POA: Diagnosis not present

## 2021-03-20 DIAGNOSIS — Z87891 Personal history of nicotine dependence: Secondary | ICD-10-CM | POA: Diagnosis not present

## 2021-03-20 DIAGNOSIS — R091 Pleurisy: Secondary | ICD-10-CM | POA: Diagnosis not present

## 2021-03-20 DIAGNOSIS — J40 Bronchitis, not specified as acute or chronic: Secondary | ICD-10-CM | POA: Diagnosis not present

## 2021-03-20 DIAGNOSIS — Z79899 Other long term (current) drug therapy: Secondary | ICD-10-CM | POA: Diagnosis not present

## 2021-03-20 DIAGNOSIS — Z20822 Contact with and (suspected) exposure to covid-19: Secondary | ICD-10-CM | POA: Diagnosis not present

## 2021-03-20 DIAGNOSIS — F419 Anxiety disorder, unspecified: Secondary | ICD-10-CM | POA: Diagnosis not present

## 2021-03-20 DIAGNOSIS — Z793 Long term (current) use of hormonal contraceptives: Secondary | ICD-10-CM | POA: Diagnosis not present

## 2021-04-03 DIAGNOSIS — J069 Acute upper respiratory infection, unspecified: Secondary | ICD-10-CM | POA: Diagnosis not present

## 2021-04-03 DIAGNOSIS — Z87891 Personal history of nicotine dependence: Secondary | ICD-10-CM | POA: Diagnosis not present

## 2021-04-03 DIAGNOSIS — Z20822 Contact with and (suspected) exposure to covid-19: Secondary | ICD-10-CM | POA: Diagnosis not present

## 2021-04-03 DIAGNOSIS — R059 Cough, unspecified: Secondary | ICD-10-CM | POA: Diagnosis not present

## 2021-04-19 DIAGNOSIS — Z7251 High risk heterosexual behavior: Secondary | ICD-10-CM | POA: Diagnosis not present

## 2021-05-16 DIAGNOSIS — Z6823 Body mass index (BMI) 23.0-23.9, adult: Secondary | ICD-10-CM | POA: Diagnosis not present

## 2021-05-16 DIAGNOSIS — F17210 Nicotine dependence, cigarettes, uncomplicated: Secondary | ICD-10-CM | POA: Diagnosis not present

## 2021-05-16 DIAGNOSIS — H6091 Unspecified otitis externa, right ear: Secondary | ICD-10-CM | POA: Diagnosis not present

## 2021-05-16 DIAGNOSIS — Z309 Encounter for contraceptive management, unspecified: Secondary | ICD-10-CM | POA: Diagnosis not present

## 2021-05-21 DIAGNOSIS — F17210 Nicotine dependence, cigarettes, uncomplicated: Secondary | ICD-10-CM | POA: Diagnosis not present

## 2021-05-21 DIAGNOSIS — Z6823 Body mass index (BMI) 23.0-23.9, adult: Secondary | ICD-10-CM | POA: Diagnosis not present

## 2021-05-21 DIAGNOSIS — H6091 Unspecified otitis externa, right ear: Secondary | ICD-10-CM | POA: Diagnosis not present

## 2021-05-21 DIAGNOSIS — M545 Low back pain, unspecified: Secondary | ICD-10-CM | POA: Diagnosis not present

## 2021-05-21 DIAGNOSIS — I10 Essential (primary) hypertension: Secondary | ICD-10-CM | POA: Diagnosis not present

## 2021-05-21 DIAGNOSIS — M7918 Myalgia, other site: Secondary | ICD-10-CM | POA: Diagnosis not present

## 2021-08-08 DIAGNOSIS — F17210 Nicotine dependence, cigarettes, uncomplicated: Secondary | ICD-10-CM | POA: Diagnosis not present

## 2021-08-08 DIAGNOSIS — Z309 Encounter for contraceptive management, unspecified: Secondary | ICD-10-CM | POA: Diagnosis not present

## 2021-08-10 ENCOUNTER — Other Ambulatory Visit (HOSPITAL_COMMUNITY): Payer: Self-pay

## 2021-08-10 DIAGNOSIS — F419 Anxiety disorder, unspecified: Secondary | ICD-10-CM | POA: Diagnosis not present

## 2021-08-10 DIAGNOSIS — R61 Generalized hyperhidrosis: Secondary | ICD-10-CM | POA: Diagnosis not present

## 2021-08-10 DIAGNOSIS — F17210 Nicotine dependence, cigarettes, uncomplicated: Secondary | ICD-10-CM | POA: Diagnosis not present

## 2021-08-10 DIAGNOSIS — N926 Irregular menstruation, unspecified: Secondary | ICD-10-CM | POA: Diagnosis not present

## 2021-08-10 MED ORDER — BUSPIRONE HCL 15 MG PO TABS
ORAL_TABLET | ORAL | 0 refills | Status: DC
  Filled 2021-08-10: qty 270, 90d supply, fill #0

## 2021-08-10 MED ORDER — DRYSOL 20 % EX SOLN
CUTANEOUS | 1 refills | Status: DC
  Filled 2021-08-10: qty 35, 30d supply, fill #0

## 2021-08-11 ENCOUNTER — Other Ambulatory Visit (HOSPITAL_COMMUNITY): Payer: Self-pay

## 2021-08-15 ENCOUNTER — Other Ambulatory Visit (HOSPITAL_COMMUNITY): Payer: Self-pay

## 2021-10-03 DIAGNOSIS — H52221 Regular astigmatism, right eye: Secondary | ICD-10-CM | POA: Diagnosis not present

## 2021-10-19 DIAGNOSIS — N926 Irregular menstruation, unspecified: Secondary | ICD-10-CM | POA: Diagnosis not present

## 2021-10-19 DIAGNOSIS — R61 Generalized hyperhidrosis: Secondary | ICD-10-CM | POA: Diagnosis not present

## 2021-10-19 DIAGNOSIS — R030 Elevated blood-pressure reading, without diagnosis of hypertension: Secondary | ICD-10-CM | POA: Diagnosis not present

## 2021-10-19 DIAGNOSIS — Z6823 Body mass index (BMI) 23.0-23.9, adult: Secondary | ICD-10-CM | POA: Diagnosis not present

## 2021-10-19 DIAGNOSIS — F419 Anxiety disorder, unspecified: Secondary | ICD-10-CM | POA: Diagnosis not present

## 2021-10-19 DIAGNOSIS — F17210 Nicotine dependence, cigarettes, uncomplicated: Secondary | ICD-10-CM | POA: Diagnosis not present

## 2021-11-01 DIAGNOSIS — Z309 Encounter for contraceptive management, unspecified: Secondary | ICD-10-CM | POA: Diagnosis not present

## 2021-11-01 DIAGNOSIS — R030 Elevated blood-pressure reading, without diagnosis of hypertension: Secondary | ICD-10-CM | POA: Diagnosis not present

## 2021-11-25 DIAGNOSIS — R300 Dysuria: Secondary | ICD-10-CM | POA: Diagnosis not present

## 2021-11-25 DIAGNOSIS — F419 Anxiety disorder, unspecified: Secondary | ICD-10-CM | POA: Diagnosis not present

## 2021-11-25 DIAGNOSIS — Z79899 Other long term (current) drug therapy: Secondary | ICD-10-CM | POA: Diagnosis not present

## 2021-11-25 DIAGNOSIS — Z881 Allergy status to other antibiotic agents status: Secondary | ICD-10-CM | POA: Diagnosis not present

## 2021-11-25 DIAGNOSIS — Z91030 Bee allergy status: Secondary | ICD-10-CM | POA: Diagnosis not present

## 2021-11-25 DIAGNOSIS — R8290 Unspecified abnormal findings in urine: Secondary | ICD-10-CM | POA: Diagnosis not present

## 2021-11-25 DIAGNOSIS — Z87891 Personal history of nicotine dependence: Secondary | ICD-10-CM | POA: Diagnosis not present

## 2022-01-06 DIAGNOSIS — Z20828 Contact with and (suspected) exposure to other viral communicable diseases: Secondary | ICD-10-CM | POA: Diagnosis not present

## 2022-01-06 DIAGNOSIS — F17210 Nicotine dependence, cigarettes, uncomplicated: Secondary | ICD-10-CM | POA: Diagnosis not present

## 2022-02-16 ENCOUNTER — Emergency Department (HOSPITAL_COMMUNITY)
Admission: EM | Admit: 2022-02-16 | Discharge: 2022-02-16 | Disposition: A | Discharge status: Home / Self Care | Payer: 59 | Attending: Emergency Medicine | Admitting: Emergency Medicine

## 2022-02-16 ENCOUNTER — Other Ambulatory Visit: Payer: Self-pay

## 2022-02-16 DIAGNOSIS — R399 Unspecified symptoms and signs involving the genitourinary system: Secondary | ICD-10-CM

## 2022-02-16 DIAGNOSIS — R3989 Other symptoms and signs involving the genitourinary system: Secondary | ICD-10-CM | POA: Diagnosis not present

## 2022-02-16 LAB — URINALYSIS, ROUTINE W REFLEX MICROSCOPIC
Bilirubin Urine: NEGATIVE
Glucose, UA: NEGATIVE mg/dL
Hgb urine dipstick: NEGATIVE
Ketones, ur: NEGATIVE mg/dL
Nitrite: NEGATIVE
Protein, ur: NEGATIVE mg/dL
Specific Gravity, Urine: 1.002 — ABNORMAL LOW (ref 1.005–1.030)
pH: 6 (ref 5.0–8.0)

## 2022-02-16 LAB — GC/CHLAMYDIA PROBE AMP (~~LOC~~) NOT AT ARMC
Chlamydia: NEGATIVE
Comment: NEGATIVE
Comment: NORMAL
Neisseria Gonorrhea: NEGATIVE

## 2022-02-16 LAB — PREGNANCY, URINE: Preg Test, Ur: NEGATIVE

## 2022-02-16 NOTE — ED Provider Triage Note (Signed)
Emergency Medicine Provider Triage Evaluation Note  Terrie Haring , a 27 y.o. female  was evaluated in triage.  Pt complains of cloudy urine.  States diagnosed with UTI in November 2023 and urine has been cloudy/discolored since.  Denies hematuria, dysuria, or frequency.  Denies fever, vomiting, or abdominal pain.  Review of Systems  Positive: Cloudy urine Negative: fever  Physical Exam  BP (!) 127/92 (BP Location: Right Arm)   Pulse 76   Temp 98 F (36.7 C)   Resp 16   SpO2 100%  Gen:   Awake, no distress   Resp:  Normal effort  MSK:   Moves extremities without difficulty  Other:  No CVA tenderness  Medical Decision Making  Medically screening exam initiated at 5:08 AM.  Appropriate orders placed.  Shirlee Whitmire was informed that the remainder of the evaluation will be completed by another provider, this initial triage assessment does not replace that evaluation, and the importance of remaining in the ED until their evaluation is complete.  Cloudy urine.  Denies other urinary symptoms.  No CVA tenderness.  UA and u-preg sent.   Larene Pickett, PA-C 02/16/22 [phone]

## 2022-02-16 NOTE — ED Triage Notes (Signed)
Pt presents to ED for concerns for recurrent UTI. c/o cloudy urine without foul smell. States ongoing symptoms when dx with UTI in November. Denies abdominal pain. Pt is also concerned for feeling lightheaded, weakness, and fatigue.

## 2022-02-16 NOTE — Discharge Instructions (Addendum)
Your urine looked normal today.  I do not think you have a urinary infection currently.  When you were treated in November, your culture did not show infection, I do not think you had a urinary infection then either.  The symptoms you are experiencing may be normal but it will be helpful for you to follow-up with an OB/GYN to get a full exam and make sure there are no further tests needed.  We will culture your urine and look for other types of infections in it, we will contact you if there needs to be any further treatment or follow-up.

## 2022-02-16 NOTE — ED Provider Notes (Signed)
[facility] Provider Note   CSN: [id number] Arrival date & time: 02/16/22  3790     History  No chief complaint on file.   Kayla Day is a 27 y.o. female.  Patient presents to the emergency department because she is concerned she may have a persistent urinary tract infection.  She was seen at another institution in November because she had cloudy urine with discomfort while urinating.  She reports that she has noticed that her urine has been persistently cloudy since this time.  There is often discoloration to the urine.  She no longer has pain with urination and there is no urinary frequency.  No unusual vaginal bleeding or vaginal discharge.  Patient reports that she has been having some symptoms of feeling lightheaded and off balance with generalized weakness and fatigue.  She is worried that this is also related to urinary infection.       Home Medications Prior to Admission medications   Medication Sig Start Date End Date Taking? Authorizing Provider  albuterol (PROVENTIL HFA;VENTOLIN HFA) 108 (90 Base) MCG/ACT inhaler Inhale 1-2 puffs into the lungs every 6 (six) hours as needed for wheezing or shortness of breath. Patient not taking: Reported on 08/12/2020    [provider]  aluminum chloride (DRYSOL) 20 % external solution Apply 1 Application Topically every 3 days 08/10/21     busPIRone (BUSPAR) 15 MG tablet Take 15 mg by mouth 3 (three) times daily. 06/07/20   [provider]  busPIRone (BUSPAR) 15 MG tablet Take 1 tablet by mouth three times a day 02/01/21     busPIRone (BUSPAR) 15 MG tablet Take 1 tablet by mouth three times a day 08/10/21     dicyclomine (BENTYL) 20 MG tablet Take 1 tablet (20 mg total) by mouth 3 (three) times daily as needed for spasms (abdominal cramping pain). Patient not taking: Reported on 08/12/2020 03/20/18   Long, Wonda Olds, MD  escitalopram (LEXAPRO) 10 MG tablet Take 1 tablet by mouth  every day 02/01/21     ibuprofen (ADVIL,MOTRIN) 800 MG tablet Take 1 tablet (800 mg total) by mouth every 8 (eight) hours as needed for moderate pain. Patient not taking: Reported on 08/12/2020 03/20/18   Margette Fast, MD  medroxyPROGESTERone (DEPO-PROVERA) 150 MG/ML injection Inject 150 mg into the muscle every 3 (three) months.  11/21/17   [provider]  medroxyPROGESTERone Acetate (DEPO-PROVERA) 150 MG/ML SUSY Inject 150 mg into the muscle every 3 months. 02/21/21         Allergies    Bee venom, Doxycycline, Hydrocodone, and Other    Review of Systems   Review of Systems  Physical Exam Updated Vital Signs BP (!) 127/92 (BP Location: Right Arm)   Pulse 76   Temp 98 F (36.7 C)   Resp 16   SpO2 100%  Physical Exam Vitals and nursing note reviewed.  Constitutional:      General: She is not in acute distress.    Appearance: She is well-developed.  HENT:     Head: Normocephalic and atraumatic.     Mouth/Throat:     Mouth: Mucous membranes are moist.  Eyes:     General: Vision grossly intact. Gaze aligned appropriately.     Extraocular Movements: Extraocular movements intact.     Conjunctiva/sclera: Conjunctivae normal.  Cardiovascular:     Rate and Rhythm: Normal rate and regular rhythm.     Pulses: Normal pulses.     Heart  sounds: Normal heart sounds, S1 normal and S2 normal. No murmur heard.    No friction rub. No gallop.  Pulmonary:     Effort: Pulmonary effort is normal. No respiratory distress.     Breath sounds: Normal breath sounds.  Abdominal:     General: Bowel sounds are normal.     Palpations: Abdomen is soft.     Tenderness: There is no abdominal tenderness. There is no guarding or rebound.     Hernia: No hernia is present.  Musculoskeletal:        General: No swelling.     Cervical back: Full passive range of motion without pain, normal range of motion and neck supple. No spinous process tenderness or muscular tenderness. Normal range of motion.      Right lower leg: No edema.     Left lower leg: No edema.  Skin:    General: Skin is warm and dry.     Capillary Refill: Capillary refill takes less than 2 seconds.     Findings: No ecchymosis, erythema, rash or wound.  Neurological:     General: No focal deficit present.     Mental Status: She is alert and oriented to person, place, and time.     GCS: GCS eye subscore is 4. GCS verbal subscore is 5. GCS motor subscore is 6.     Cranial Nerves: Cranial nerves 2-12 are intact.     Sensory: Sensation is intact.     Motor: Motor function is intact.     Coordination: Coordination is intact.  Psychiatric:        Attention and Perception: Attention normal.        Mood and Affect: Mood normal.        Speech: Speech normal.        Behavior: Behavior normal.     ED Results / Procedures / Treatments   Labs (all labs ordered are listed, but only abnormal results are displayed) Labs Reviewed  URINALYSIS, ROUTINE W REFLEX MICROSCOPIC - Abnormal; Notable for the following components:      Result Value   Color, Urine STRAW (*)    APPearance HAZY (*)    Specific Gravity, Urine 1.002 (*)    Leukocytes,Ua SMALL (*)    Bacteria, UA RARE (*)    All other components within normal limits  URINE CULTURE  PREGNANCY, URINE  GC/CHLAMYDIA PROBE AMP ([place]) NOT AT [facility]    EKG None  Radiology No results found.  Procedures Procedures    Medications Ordered in ED Medications - No data to display  ED Course/ Medical Decision Making/ A&P                             Medical Decision Making Amount and/or Complexity of Data Reviewed External Data Reviewed: labs and notes. Labs: ordered. Decision-making details documented in ED Course.   I did review her records from [facility] in November.  She presented with dysuria and had a slightly abnormal urinalysis.  She was treated with pyridium and Macrobid.  I did review her culture results, however, and it appeared to be normal flora, no  pathogens were identified.  It does not seem that she had a clear urinary tract infection at that time and her urinalysis today does not suggest infection.  Urine culture sent.  Will send GC and chlamydia on urine to make sure there is not urinary colonization.  I doubt this is  the case.  I have recommended that she follow-up with an OB/GYN for full exam because she has not had a gynecologic exam in 6 years.  She does not need any further treatment at this time.        Final Clinical Impression(s) / ED Diagnoses Final diagnoses:  Lower urinary tract symptoms    Rx / DC Orders ED Discharge Orders     None         Rafan Sanders, Gwenyth Allegra, MD 02/16/22 [phone]

## 2022-02-16 NOTE — ED Notes (Signed)
RN reviewed discharge instructions with pt. Pt verbalized understanding and had no further questions. VSS upon discharge. ?

## 2022-02-17 LAB — URINE CULTURE

## 2022-03-21 DIAGNOSIS — Z881 Allergy status to other antibiotic agents status: Secondary | ICD-10-CM | POA: Diagnosis not present

## 2022-03-21 DIAGNOSIS — F419 Anxiety disorder, unspecified: Secondary | ICD-10-CM | POA: Diagnosis not present

## 2022-03-21 DIAGNOSIS — Z91030 Bee allergy status: Secondary | ICD-10-CM | POA: Diagnosis not present

## 2022-03-21 DIAGNOSIS — R350 Frequency of micturition: Secondary | ICD-10-CM | POA: Diagnosis not present

## 2022-03-21 DIAGNOSIS — R3915 Urgency of urination: Secondary | ICD-10-CM | POA: Diagnosis not present

## 2022-03-21 DIAGNOSIS — R1030 Lower abdominal pain, unspecified: Secondary | ICD-10-CM | POA: Diagnosis not present

## 2022-03-21 DIAGNOSIS — Z87891 Personal history of nicotine dependence: Secondary | ICD-10-CM | POA: Diagnosis not present

## 2022-03-29 DIAGNOSIS — Z0000 Encounter for general adult medical examination without abnormal findings: Secondary | ICD-10-CM | POA: Diagnosis not present

## 2022-03-29 DIAGNOSIS — R61 Generalized hyperhidrosis: Secondary | ICD-10-CM | POA: Diagnosis not present

## 2022-03-29 DIAGNOSIS — N926 Irregular menstruation, unspecified: Secondary | ICD-10-CM | POA: Diagnosis not present

## 2022-03-29 DIAGNOSIS — F17210 Nicotine dependence, cigarettes, uncomplicated: Secondary | ICD-10-CM | POA: Diagnosis not present

## 2022-03-29 DIAGNOSIS — Z6826 Body mass index (BMI) 26.0-26.9, adult: Secondary | ICD-10-CM | POA: Diagnosis not present

## 2022-03-29 DIAGNOSIS — R030 Elevated blood-pressure reading, without diagnosis of hypertension: Secondary | ICD-10-CM | POA: Diagnosis not present

## 2022-03-29 DIAGNOSIS — F419 Anxiety disorder, unspecified: Secondary | ICD-10-CM | POA: Diagnosis not present

## 2022-04-12 ENCOUNTER — Other Ambulatory Visit (HOSPITAL_BASED_OUTPATIENT_CLINIC_OR_DEPARTMENT_OTHER): Payer: Self-pay

## 2022-04-12 ENCOUNTER — Telehealth (INDEPENDENT_AMBULATORY_CARE_PROVIDER_SITE_OTHER): Payer: 59 | Admitting: Pulmonary Disease

## 2022-04-12 DIAGNOSIS — R1111 Vomiting without nausea: Secondary | ICD-10-CM

## 2022-04-12 DIAGNOSIS — R0600 Dyspnea, unspecified: Secondary | ICD-10-CM

## 2022-04-12 MED ORDER — ONDANSETRON HCL 8 MG PO TABS
8.0000 mg | ORAL_TABLET | Freq: Three times a day (TID) | ORAL | 0 refills | Status: DC | PRN
  Filled 2022-04-12: qty 20, 7d supply, fill #0

## 2022-04-12 NOTE — Telephone Encounter (Signed)
Virtual Visit via Telephone Note  I connected with Kayla Day on 04/12/22 at  by telephone and verified that I am speaking with the correct person using two identifiers.  Location: Patient: Home Provider: [facility]   I discussed the limitations, risks, security and privacy concerns of performing an evaluation and management service by telephone and the availability of in person appointments. I also discussed with the patient that there may be a patient responsible charge related to this service. The patient expressed understanding and agreed to proceed.   History of Present Illness: 27 year old female with no pertinent medical history who presents with nausea, dizziness, shortness of breath. No recent fevers, chills. No sick contacts.   Observations/Objective: No acute distress  Assessment and Plan: Shortness of breath, nausea, dizziness - nonspecific complaints. Doubt this is respiratory etiology. --PRN zofran --Supportive management --Consider pregnancy test if appropriate  Follow Up Instructions:    I discussed the assessment and treatment plan with the patient. The patient was provided an opportunity to ask questions and all were answered. The patient agreed with the plan and demonstrated an understanding of the instructions.   The patient was advised to call back or seek an in-person evaluation if the symptoms worsen or if the condition fails to improve as anticipated.  I provided 20 minutes of non-face-to-face time during this encounter.   Ladanian Kelter Rodman Pickle, MD

## 2022-04-21 ENCOUNTER — Other Ambulatory Visit (HOSPITAL_BASED_OUTPATIENT_CLINIC_OR_DEPARTMENT_OTHER): Payer: Self-pay

## 2022-04-21 ENCOUNTER — Telehealth: Payer: Self-pay | Admitting: Nurse Practitioner

## 2022-04-21 DIAGNOSIS — J209 Acute bronchitis, unspecified: Secondary | ICD-10-CM

## 2022-04-21 MED ORDER — PREDNISONE 20 MG PO TABS: 40.0000 mg | ORAL_TABLET | Freq: Every day | ORAL | 0 refills | Status: AC

## 2022-04-21 MED ORDER — BENZONATATE 200 MG PO CAPS: 200.0000 mg | ORAL_CAPSULE | Freq: Three times a day (TID) | ORAL | 1 refills | Status: DC | PRN

## 2022-04-21 MED ORDER — ALBUTEROL SULFATE HFA 108 (90 BASE) MCG/ACT IN AERS: 2.0000 | INHALATION_SPRAY | Freq: Four times a day (QID) | RESPIRATORY_TRACT | 2 refills | Status: DC | PRN

## 2022-04-21 MED ORDER — AZITHROMYCIN 250 MG PO TABS: ORAL_TABLET | ORAL | 0 refills | Status: DC

## 2022-04-21 NOTE — Telephone Encounter (Signed)
04/21/2022 Today: Patient contacted the office with increased cough over the last 3 weeks. She is producing a small amount of phlegm but is not sure if it is discolored. She finds her cough is worse in the evenings and at night. She has noticed some associated wheezing and shortness of breath. The cough makes her feel nauseated at times. She doesn't have any significant nasal symptoms but her throat feels irritated in the morning. She denies any fevers, chills, hemoptysis, recent sick exposures, leg swelling, orthopnea. She does not have a history of asthma. She completed a FeNO at her workplace, which was elevated at 54 ppb. Plan to treat her for acute bronchitis with empiric azithromycin and prednisone burst. Cough control measures advised. Provided with rescue inhaler. Side effect profiles reviewed. Start flonase nasal spray and daily antihistamine. Advised that if she did not improve, she would need in person evaluation. Verbalized understanding. Nothing further needed.

## 2022-04-24 ENCOUNTER — Telehealth (HOSPITAL_BASED_OUTPATIENT_CLINIC_OR_DEPARTMENT_OTHER): Payer: Self-pay

## 2022-05-25 ENCOUNTER — Other Ambulatory Visit (HOSPITAL_BASED_OUTPATIENT_CLINIC_OR_DEPARTMENT_OTHER): Payer: Self-pay

## 2022-05-26 DIAGNOSIS — R030 Elevated blood-pressure reading, without diagnosis of hypertension: Secondary | ICD-10-CM | POA: Diagnosis not present

## 2022-05-26 DIAGNOSIS — Z6827 Body mass index (BMI) 27.0-27.9, adult: Secondary | ICD-10-CM | POA: Diagnosis not present

## 2022-05-26 DIAGNOSIS — Z3490 Encounter for supervision of normal pregnancy, unspecified, unspecified trimester: Secondary | ICD-10-CM | POA: Diagnosis not present

## 2022-05-26 DIAGNOSIS — F17210 Nicotine dependence, cigarettes, uncomplicated: Secondary | ICD-10-CM | POA: Diagnosis not present

## 2022-05-31 ENCOUNTER — Other Ambulatory Visit: Payer: Self-pay | Admitting: Obstetrics & Gynecology

## 2022-05-31 DIAGNOSIS — O3680X0 Pregnancy with inconclusive fetal viability, not applicable or unspecified: Secondary | ICD-10-CM

## 2022-06-01 ENCOUNTER — Ambulatory Visit (INDEPENDENT_AMBULATORY_CARE_PROVIDER_SITE_OTHER): Payer: 59

## 2022-06-01 DIAGNOSIS — Z3481 Encounter for supervision of other normal pregnancy, first trimester: Secondary | ICD-10-CM

## 2022-06-01 DIAGNOSIS — Z3A08 8 weeks gestation of pregnancy: Secondary | ICD-10-CM

## 2022-06-01 DIAGNOSIS — O3680X0 Pregnancy with inconclusive fetal viability, not applicable or unspecified: Secondary | ICD-10-CM

## 2022-06-01 NOTE — Progress Notes (Signed)
[place] 8+2 wks,single IUP with yolk sac,CRL 18.38 mm,normal ovaries,FHR 167 bpm

## 2022-06-14 DIAGNOSIS — Z3689 Encounter for other specified antenatal screening: Secondary | ICD-10-CM | POA: Diagnosis not present

## 2022-06-14 DIAGNOSIS — N912 Amenorrhea, unspecified: Secondary | ICD-10-CM | POA: Diagnosis not present

## 2022-06-14 DIAGNOSIS — Z3A10 10 weeks gestation of pregnancy: Secondary | ICD-10-CM | POA: Diagnosis not present

## 2022-06-14 DIAGNOSIS — O219 Vomiting of pregnancy, unspecified: Secondary | ICD-10-CM | POA: Diagnosis not present

## 2022-06-14 DIAGNOSIS — Z3201 Encounter for pregnancy test, result positive: Secondary | ICD-10-CM | POA: Diagnosis not present

## 2022-06-22 LAB — PANORAMA PRENATAL TEST FULL PANEL:PANORAMA TEST PLUS 5 ADDITIONAL MICRODELETIONS: FETAL FRACTION: 5.7

## 2022-07-03 ENCOUNTER — Other Ambulatory Visit: Payer: Self-pay

## 2022-07-03 DIAGNOSIS — Z3682 Encounter for antenatal screening for nuchal translucency: Secondary | ICD-10-CM

## 2022-07-05 ENCOUNTER — Other Ambulatory Visit: Payer: 59

## 2022-07-06 ENCOUNTER — Encounter: Payer: Self-pay | Admitting: *Deleted

## 2022-07-06 ENCOUNTER — Encounter: Payer: Self-pay | Admitting: Advanced Practice Midwife

## 2022-07-12 DIAGNOSIS — Z3689 Encounter for other specified antenatal screening: Secondary | ICD-10-CM | POA: Diagnosis not present

## 2022-07-27 ENCOUNTER — Other Ambulatory Visit (HOSPITAL_BASED_OUTPATIENT_CLINIC_OR_DEPARTMENT_OTHER): Payer: Self-pay | Admitting: Student

## 2022-07-27 ENCOUNTER — Ambulatory Visit (INDEPENDENT_AMBULATORY_CARE_PROVIDER_SITE_OTHER): Payer: 59

## 2022-07-27 ENCOUNTER — Ambulatory Visit (INDEPENDENT_AMBULATORY_CARE_PROVIDER_SITE_OTHER): Payer: 59 | Admitting: Student

## 2022-07-27 DIAGNOSIS — M25872 Other specified joint disorders, left ankle and foot: Secondary | ICD-10-CM | POA: Diagnosis not present

## 2022-07-27 DIAGNOSIS — M79672 Pain in left foot: Secondary | ICD-10-CM

## 2022-07-27 DIAGNOSIS — M79675 Pain in left toe(s): Secondary | ICD-10-CM | POA: Diagnosis not present

## 2022-07-27 NOTE — Progress Notes (Signed)
Chief Complaint: Left big toe pain     History of Present Illness:    Kayla Day is a 27 y.o. female presenting today for evaluation of her left big toe.  She states that she began having pain about a year ago with no known injury.  This has gradually continued to worsen and is now at the point where she has pain every day.  She describes that it often feels like it needs to pop, which often she is able to do and gets relief.  This relief is typically not long-lasting.  She denies any other treatments up to this point.  She is currently [redacted] weeks pregnant.   Surgical History:   None  PMH/PSH/Family History/Social History/Meds/Allergies:   No past medical history on file. No past surgical history on file. Social History   Socioeconomic History   Marital status: Single    Spouse name: Not on file   Number of children: Not on file   Years of education: Not on file   Highest education level: Not on file  Occupational History   Not on file  Tobacco Use   Smoking status: Every Day    Current packs/day: 1.00    Types: Cigarettes   Smokeless tobacco: Not on file  Substance and Sexual Activity   Alcohol use: Yes    Comment: occ   Drug use: Never   Sexual activity: Not on file  Other Topics Concern   Not on file  Social History Narrative   Not on file   Social Determinants of Health   Financial Resource Strain: Not on file  Food Insecurity: Not on file  Transportation Needs: Not on file  Physical Activity: Not on file  Stress: Not on file  Social Connections: Not on file   No family history on file. Allergies  Allergen Reactions   Bee Venom Swelling    numbness   Doxycycline    Hydrocodone Hives   Other Hives    TIDE PODS   Current Outpatient Medications  Medication Sig Dispense Refill   albuterol (VENTOLIN HFA) 108 (90 Base) MCG/ACT inhaler Inhale 2 puffs into the lungs every 6 (six) hours as needed for wheezing or  shortness of breath. 8 g 2   aluminum chloride (DRYSOL) 20 % external solution Apply 1 Application Topically every 3 days 35 mL 1   azithromycin (ZITHROMAX) 250 MG tablet Take 2 tablets on day one then take 1 tablet daily for four additional days 6 tablet 0   benzonatate (TESSALON) 200 MG capsule Take 1 capsule (200 mg total) by mouth 3 (three) times daily as needed for cough. 30 capsule 1   busPIRone (BUSPAR) 15 MG tablet Take 15 mg by mouth 3 (three) times daily.     busPIRone (BUSPAR) 15 MG tablet Take 1 tablet by mouth three times a day 270 tablet 0   busPIRone (BUSPAR) 15 MG tablet Take 1 tablet by mouth three times a day 270 tablet 0   dicyclomine (BENTYL) 20 MG tablet Take 1 tablet (20 mg total) by mouth 3 (three) times daily as needed for spasms (abdominal cramping pain). (Patient not taking: Reported on 08/12/2020) 20 tablet 0   escitalopram (LEXAPRO) 10 MG tablet Take 1 tablet by mouth every day 30 tablet 0   ibuprofen (ADVIL,MOTRIN) 800 MG tablet  Take 1 tablet (800 mg total) by mouth every 8 (eight) hours as needed for moderate pain. (Patient not taking: Reported on 08/12/2020) 21 tablet 0   medroxyPROGESTERone (DEPO-PROVERA) 150 MG/ML injection Inject 150 mg into the muscle every 3 (three) months.      medroxyPROGESTERone Acetate (DEPO-PROVERA) 150 MG/ML SUSY Inject 150 mg into the muscle every 3 months. 1 mL 0   ondansetron (ZOFRAN) 8 MG tablet Take 1 tablet (8 mg total) by mouth every 8 (eight) hours as needed for nausea or vomiting. 20 tablet 0   No current facility-administered medications for this visit.   No results found.  Review of Systems:   A ROS was performed including pertinent positives and negatives as documented in the HPI.  Physical Exam :   Constitutional: NAD and appears stated age Neurological: Alert and oriented Psych: Appropriate affect and cooperative Last menstrual period 04/04/2022.   Comprehensive Musculoskeletal Exam:    Tenderness palpation  surrounding the first MTP joint.  She does have full range of motion with flexion and extension.  Slight tenderness and pain extending down into the distal first metatarsal.  No pain with varus or valgus stress to the big toe.  DP pulse 2+.  Neurosensory exam intact.  Imaging:   Xray (left great toe 3 views): Negative for fracture or dislocation.    I personally reviewed and interpreted the radiographs.   Assessment:   27 y.o. female with chronic pain of her left great toe and has been slowly worsening over the past few months.  Based on her exam and presentation, I do believe that this presents like sesamoiditis.  She has not tried any treatments up to this point, so I would recommend beginning with conservative therapies.  We did discuss wearing soft, supportive footwear as well as icing as needed.  I would also recommend a short course of some anti-inflammatories, but will have patient discuss this with her OB before proceeding.  I am hopeful that she will begin to get some relief from reducing the inflammation, but can continue to follow as needed.  Plan :    -Continue with RICE therapy and anti-inflammatories as needed -Return to clinic as needed     I personally saw and evaluated the patient, and participated in the management and treatment plan.  Hazle Nordmann, PA-C Orthopedics  This document was dictated using Conservation officer, historic buildings. A reasonable attempt at proof reading has been made to minimize errors.

## 2022-08-16 DIAGNOSIS — Z3689 Encounter for other specified antenatal screening: Secondary | ICD-10-CM | POA: Diagnosis not present

## 2022-08-16 DIAGNOSIS — Z363 Encounter for antenatal screening for malformations: Secondary | ICD-10-CM | POA: Diagnosis not present

## 2022-08-16 DIAGNOSIS — Z3A19 19 weeks gestation of pregnancy: Secondary | ICD-10-CM | POA: Diagnosis not present

## 2023-07-18 ENCOUNTER — Ambulatory Visit: Admitting: Surgical

## 2023-07-19 ENCOUNTER — Encounter: Payer: Self-pay | Admitting: Orthopaedic Surgery

## 2023-07-19 ENCOUNTER — Ambulatory Visit: Admitting: Orthopaedic Surgery

## 2023-07-19 ENCOUNTER — Other Ambulatory Visit (INDEPENDENT_AMBULATORY_CARE_PROVIDER_SITE_OTHER): Payer: Self-pay

## 2023-07-19 VITALS — Wt 173.6 lb

## 2023-07-19 DIAGNOSIS — M25561 Pain in right knee: Secondary | ICD-10-CM

## 2023-07-19 DIAGNOSIS — G8929 Other chronic pain: Secondary | ICD-10-CM

## 2023-07-19 MED ORDER — NAPROXEN 500 MG PO TABS: 500.0000 mg | ORAL_TABLET | Freq: Two times a day (BID) | ORAL | 5 refills | Status: AC

## 2023-07-19 NOTE — Progress Notes (Signed)
 Subjective:    Patient ID: Kayla Day, female    DOB: 1996/01/13, 28 y.o.   MRN: [id number]  HPI She has been having pain of the right knee for several months with more pain over the last month.  She has some pain to the left knee. She has slight swelling.  She has had patches of discoloration to the lower leg at times.  She has no trauma.  She has giving way at times infrequently.  She has no redness, no numbness.  She does not like to take any medicine.  She has used some heat at times.  It is not getting any better.   Review of Systems  Constitutional:  Positive for activity change.  Musculoskeletal:  Positive for arthralgias, gait problem and joint swelling.  For Review of Systems, all other systems reviewed and are negative.  The following is a summary of the past history medically, past history surgically, known current medicines, social history and family history.  This information is gathered electronically by the computer from prior information and documentation.  I review this each visit and have found including this information at this point in the chart is beneficial and informative.   History reviewed. No pertinent past medical history.  History reviewed. No pertinent surgical history.  Current Outpatient Medications on File Prior to Visit  Medication Sig Dispense Refill   medroxyPROGESTERone  Acetate (DEPO-PROVERA ) 150 MG/ML SUSY Inject 150 mg into the muscle every 3 months. 1 mL 0   albuterol  (VENTOLIN  HFA) 108 (90 Base) MCG/ACT inhaler Inhale 2 puffs into the lungs every 6 (six) hours as needed for wheezing or shortness of breath. (Patient not taking: Reported on 07/19/2023) 8 g 2   aluminum  chloride (DRYSOL) 20 % external solution Apply 1 Application Topically every 3 days (Patient not taking: Reported on 07/19/2023) 35 mL 1   azithromycin  (ZITHROMAX ) 250 MG tablet Take 2 tablets on day one then take 1 tablet daily for four additional days (Patient not taking: Reported on  07/19/2023) 6 tablet 0   benzonatate  (TESSALON ) 200 MG capsule Take 1 capsule (200 mg total) by mouth 3 (three) times daily as needed for cough. (Patient not taking: Reported on 07/19/2023) 30 capsule 1   busPIRone  (BUSPAR ) 15 MG tablet Take 15 mg by mouth 3 (three) times daily. (Patient not taking: Reported on 07/19/2023)     busPIRone  (BUSPAR ) 15 MG tablet Take 1 tablet by mouth three times a day (Patient not taking: Reported on 07/19/2023) 270 tablet 0   busPIRone  (BUSPAR ) 15 MG tablet Take 1 tablet by mouth three times a day (Patient not taking: Reported on 07/19/2023) 270 tablet 0   dicyclomine  (BENTYL ) 20 MG tablet Take 1 tablet (20 mg total) by mouth 3 (three) times daily as needed for spasms (abdominal cramping pain). (Patient not taking: Reported on 07/19/2023) 20 tablet 0   escitalopram  (LEXAPRO ) 10 MG tablet Take 1 tablet by mouth every day (Patient not taking: Reported on 07/19/2023) 30 tablet 0   ibuprofen  (ADVIL ,MOTRIN ) 800 MG tablet Take 1 tablet (800 mg total) by mouth every 8 (eight) hours as needed for moderate pain. (Patient not taking: Reported on 07/19/2023) 21 tablet 0   medroxyPROGESTERone  (DEPO-PROVERA ) 150 MG/ML injection Inject 150 mg into the muscle every 3 (three) months.  (Patient not taking: Reported on 07/19/2023)     ondansetron  (ZOFRAN ) 8 MG tablet Take 1 tablet (8 mg total) by mouth every 8 (eight) hours as needed for nausea or vomiting. (Patient not taking:  Reported on 07/19/2023) 20 tablet 0   No current facility-administered medications on file prior to visit.    Social History   Socioeconomic History   Marital status: Single    Spouse name: Not on file   Number of children: Not on file   Years of education: Not on file   Highest education level: Not on file  Occupational History   Not on file  Tobacco Use   Smoking status: Every Day    Current packs/day: 1.00    Types: Cigarettes   Smokeless tobacco: Not on file  Substance and Sexual Activity   Alcohol use: Yes     Comment: occ   Drug use: Never   Sexual activity: Not on file  Other Topics Concern   Not on file  Social History Narrative   Not on file   Social Drivers of Health   Financial Resource Strain: Low Risk  (12/31/2022)   Received from [facility]   Overall Financial Resource Strain (CARDIA)    Difficulty of Paying Living Expenses: Not hard at all  Food Insecurity: No Food Insecurity (12/31/2022)   Received from [facility]   Hunger Vital Sign    Within the past 12 months, you worried that your food would run out before you got the money to buy more.: Never true    Within the past 12 months, the food you bought just didn't last and you didn't have money to get more.: Never true  Transportation Needs: No Transportation Needs (12/31/2022)   Received from [facility]   PRAPARE - Transportation    Lack of Transportation (Medical): No    Lack of Transportation (Non-Medical): No  Physical Activity: Not on file  Stress: Not on file  Social Connections: Not on file  Intimate Partner Violence: Not At Risk (12/31/2022)   Received from [facility]   Humiliation, Afraid, Rape, and Kick questionnaire    Within the last year, have you been afraid of your partner or ex-partner?: No    Within the last year, have you been humiliated or emotionally abused in other ways by your partner or ex-partner?: No    Within the last year, have you been kicked, hit, slapped, or otherwise physically hurt by your partner or ex-partner?: No    Within the last year, have you been raped or forced to have any kind of sexual activity by your partner or ex-partner?: No    History reviewed. No pertinent family history.  Wt 173 lb 9.6 oz (78.7 kg)   BMI 26.40 kg/m   Body mass index is 26.4 kg/m.      Objective:   Physical Exam Vitals and nursing note reviewed. Exam conducted with a chaperone present.  Constitutional:      Appearance: She is well-developed.  HENT:     Head: Normocephalic and  atraumatic.  Eyes:     Conjunctiva/sclera: Conjunctivae normal.     Pupils: Pupils are equal, round, and reactive to light.  Cardiovascular:     Rate and Rhythm: Normal rate and regular rhythm.  Pulmonary:     Effort: Pulmonary effort is normal.  Abdominal:     Palpations: Abdomen is soft.  Musculoskeletal:     Cervical back: Normal range of motion and neck supple.       Legs:  Skin:    General: Skin is warm and dry.  Neurological:     Mental Status: She is alert and oriented to person, place, and time.  Cranial Nerves: No cranial nerve deficit.     Motor: No abnormal muscle tone.     Coordination: Coordination normal.     Deep Tendon Reflexes: Reflexes are normal and symmetric. Reflexes normal.  Psychiatric:        Behavior: Behavior normal.        Thought Content: Thought content normal.        Judgment: Judgment normal.   X-rays were done of the right knee, reported separately.  Negative.        Assessment & Plan:   Encounter Diagnosis  Name Primary?   Chronic pain of right knee Yes   I would like to begin naprosyn 500 po bid.  She may need MRI as I am concerned about medial meniscus tear.  Return in three weeks.  Call if any problem.  Precautions discussed.  Electronically Signed Lemond Stable, MD 7/3/202511:04 AM

## 2023-08-09 ENCOUNTER — Ambulatory Visit: Admitting: Orthopaedic Surgery

## 2023-09-07 ENCOUNTER — Encounter: Payer: Self-pay | Admitting: Radiology

## 2023-11-19 ENCOUNTER — Encounter: Payer: Self-pay | Admitting: Radiology
